# Patient Record
Sex: Female | Born: 1960
Health system: Southern US, Community
[De-identification: ages and names within clinical notes are randomized; demographics above are authoritative.]

## PROBLEM LIST (undated history)

## (undated) DIAGNOSIS — I10 Essential (primary) hypertension: Secondary | ICD-10-CM

## (undated) DIAGNOSIS — K649 Unspecified hemorrhoids: Secondary | ICD-10-CM

## (undated) DIAGNOSIS — M1990 Unspecified osteoarthritis, unspecified site: Secondary | ICD-10-CM

## (undated) DIAGNOSIS — IMO0001 Reserved for inherently not codable concepts without codable children: Secondary | ICD-10-CM

## (undated) HISTORY — DX: Reserved for inherently not codable concepts without codable children: IMO0001

## (undated) HISTORY — DX: Unspecified osteoarthritis, unspecified site: M19.90

## (undated) HISTORY — DX: Essential (primary) hypertension: I10

## (undated) HISTORY — DX: Unspecified hemorrhoids: K64.9

## (undated) HISTORY — PX: POLYPECTOMY: SHX149

## (undated) HISTORY — PX: TUBAL LIGATION: SHX77

## (undated) HISTORY — PX: OTHER SURGICAL HISTORY: SHX169

---

## 1989-02-20 HISTORY — PX: OTHER SURGICAL HISTORY: SHX169

## 1992-02-21 HISTORY — PX: CHOLECYSTECTOMY: SHX55

## 1997-08-25 ENCOUNTER — Other Ambulatory Visit: Admission: RE | Admit: 1997-08-25 | Discharge: 1997-08-25 | Payer: Self-pay | Admitting: Obstetrics & Gynecology

## 1997-09-29 ENCOUNTER — Other Ambulatory Visit: Admission: RE | Admit: 1997-09-29 | Discharge: 1997-09-29 | Payer: Self-pay | Admitting: Obstetrics & Gynecology

## 2002-02-20 HISTORY — PX: VAGINAL HYSTERECTOMY: SUR661

## 2002-02-20 HISTORY — PX: OTHER SURGICAL HISTORY: SHX169

## 2002-10-07 ENCOUNTER — Other Ambulatory Visit: Admission: RE | Admit: 2002-10-07 | Discharge: 2002-10-07 | Payer: Self-pay | Admitting: Obstetrics and Gynecology

## 2003-02-02 ENCOUNTER — Observation Stay (HOSPITAL_COMMUNITY): Admission: RE | Admit: 2003-02-02 | Discharge: 2003-02-03 | Payer: Self-pay | Admitting: Obstetrics and Gynecology

## 2003-02-02 ENCOUNTER — Encounter (INDEPENDENT_AMBULATORY_CARE_PROVIDER_SITE_OTHER): Payer: Self-pay | Admitting: *Deleted

## 2004-09-29 ENCOUNTER — Other Ambulatory Visit: Admission: RE | Admit: 2004-09-29 | Discharge: 2004-09-29 | Payer: Self-pay | Admitting: Obstetrics and Gynecology

## 2004-11-15 ENCOUNTER — Ambulatory Visit (HOSPITAL_COMMUNITY): Admission: RE | Admit: 2004-11-15 | Discharge: 2004-11-15 | Payer: Self-pay | Admitting: Obstetrics and Gynecology

## 2005-03-06 ENCOUNTER — Encounter: Payer: Self-pay | Admitting: Family Medicine

## 2005-03-06 LAB — HM MAMMOGRAPHY: HM Mammogram: NEGATIVE

## 2005-03-06 LAB — CONVERTED CEMR LAB: Pap Smear: NEGATIVE

## 2006-04-20 ENCOUNTER — Ambulatory Visit: Payer: Self-pay | Admitting: Family Medicine

## 2006-09-20 ENCOUNTER — Encounter: Admission: RE | Admit: 2006-09-20 | Discharge: 2006-09-20 | Payer: Self-pay | Admitting: Occupational Medicine

## 2006-10-04 ENCOUNTER — Encounter: Admission: RE | Admit: 2006-10-04 | Discharge: 2006-10-04 | Payer: Self-pay | Admitting: Occupational Medicine

## 2007-07-31 ENCOUNTER — Ambulatory Visit: Payer: Self-pay | Admitting: Family Medicine

## 2007-07-31 DIAGNOSIS — IMO0001 Reserved for inherently not codable concepts without codable children: Secondary | ICD-10-CM | POA: Insufficient documentation

## 2007-07-31 DIAGNOSIS — R071 Chest pain on breathing: Secondary | ICD-10-CM | POA: Insufficient documentation

## 2007-07-31 HISTORY — DX: Reserved for inherently not codable concepts without codable children: IMO0001

## 2007-08-14 ENCOUNTER — Telehealth (INDEPENDENT_AMBULATORY_CARE_PROVIDER_SITE_OTHER): Payer: Self-pay | Admitting: Internal Medicine

## 2007-09-03 ENCOUNTER — Ambulatory Visit: Payer: Self-pay | Admitting: Family Medicine

## 2007-09-11 ENCOUNTER — Ambulatory Visit: Payer: Self-pay | Admitting: Family Medicine

## 2007-09-16 LAB — CONVERTED CEMR LAB
ALT: 11 units/L (ref 0–35)
AST: 17 units/L (ref 0–37)
Alkaline Phosphatase: 47 units/L (ref 39–117)
BUN: 13 mg/dL (ref 6–23)
Basophils Absolute: 0 10*3/uL (ref 0.0–0.1)
Basophils Relative: 0.8 % (ref 0.0–3.0)
CO2: 28 meq/L (ref 19–32)
Chloride: 108 meq/L (ref 96–112)
Creatinine, Ser: 0.7 mg/dL (ref 0.4–1.2)
Eosinophils Absolute: 0.2 10*3/uL (ref 0.0–0.7)
Eosinophils Relative: 4.2 % (ref 0.0–5.0)
GFR calc non Af Amer: 97 mL/min
HDL: 51.1 mg/dL (ref >39.0)
LDL Cholesterol: 118 mg/dL — ABNORMAL HIGH (ref 0–99)
Lymphocytes Relative: 31.4 % (ref 12.0–46.0)
Neutrophils Relative %: 53.3 % (ref 43.0–77.0)
Platelets: 279 10*3/uL (ref 150–400)
Potassium: 4.1 meq/L (ref 3.5–5.1)
RBC: 4.47 M/uL (ref 3.87–5.11)
TSH: 1.1 microintl units/mL (ref 0.35–5.50)
Total Bilirubin: 0.9 mg/dL (ref 0.3–1.2)
VLDL: 9 mg/dL (ref 0–40)
WBC: 4 10*3/uL — ABNORMAL LOW (ref 4.5–10.5)

## 2007-12-02 ENCOUNTER — Ambulatory Visit: Payer: Self-pay | Admitting: Obstetrics and Gynecology

## 2007-12-02 ENCOUNTER — Encounter: Payer: Self-pay | Admitting: Obstetrics and Gynecology

## 2007-12-02 ENCOUNTER — Other Ambulatory Visit: Admission: RE | Admit: 2007-12-02 | Discharge: 2007-12-02 | Payer: Self-pay | Admitting: Obstetrics and Gynecology

## 2007-12-27 ENCOUNTER — Encounter: Admission: RE | Admit: 2007-12-27 | Discharge: 2007-12-27 | Payer: Self-pay | Admitting: Surgery

## 2008-01-09 ENCOUNTER — Encounter (INDEPENDENT_AMBULATORY_CARE_PROVIDER_SITE_OTHER): Payer: Self-pay | Admitting: Internal Medicine

## 2008-01-09 ENCOUNTER — Ambulatory Visit: Payer: Self-pay | Admitting: Family Medicine

## 2008-01-09 DIAGNOSIS — I10 Essential (primary) hypertension: Secondary | ICD-10-CM | POA: Insufficient documentation

## 2008-01-30 ENCOUNTER — Ambulatory Visit (HOSPITAL_BASED_OUTPATIENT_CLINIC_OR_DEPARTMENT_OTHER): Admission: RE | Admit: 2008-01-30 | Discharge: 2008-01-30 | Payer: Self-pay | Admitting: Surgery

## 2008-01-30 ENCOUNTER — Encounter (INDEPENDENT_AMBULATORY_CARE_PROVIDER_SITE_OTHER): Payer: Self-pay | Admitting: Surgery

## 2008-02-11 ENCOUNTER — Ambulatory Visit: Payer: Self-pay | Admitting: Family Medicine

## 2008-05-26 ENCOUNTER — Encounter: Admission: RE | Admit: 2008-05-26 | Discharge: 2008-05-26 | Payer: Self-pay | Admitting: Obstetrics and Gynecology

## 2008-06-19 ENCOUNTER — Ambulatory Visit: Payer: Self-pay | Admitting: Family Medicine

## 2008-06-25 ENCOUNTER — Encounter (INDEPENDENT_AMBULATORY_CARE_PROVIDER_SITE_OTHER): Payer: Self-pay | Admitting: Internal Medicine

## 2008-06-25 ENCOUNTER — Ambulatory Visit: Payer: Self-pay | Admitting: Family Medicine

## 2008-12-02 ENCOUNTER — Encounter: Payer: Self-pay | Admitting: Obstetrics and Gynecology

## 2008-12-02 ENCOUNTER — Ambulatory Visit: Payer: Self-pay | Admitting: Obstetrics and Gynecology

## 2008-12-02 ENCOUNTER — Other Ambulatory Visit: Admission: RE | Admit: 2008-12-02 | Discharge: 2008-12-02 | Payer: Self-pay | Admitting: Obstetrics and Gynecology

## 2009-01-27 ENCOUNTER — Ambulatory Visit: Payer: Self-pay | Admitting: Family Medicine

## 2009-01-29 ENCOUNTER — Ambulatory Visit: Payer: Self-pay | Admitting: Family Medicine

## 2009-02-03 LAB — CONVERTED CEMR LAB
Alkaline Phosphatase: 32 units/L — ABNORMAL LOW (ref 39–117)
BUN: 7 mg/dL (ref 6–23)
Basophils Absolute: 0 10*3/uL (ref 0.0–0.1)
Basophils Relative: 0.4 % (ref 0.0–3.0)
Bilirubin, Direct: 0 mg/dL (ref 0.0–0.3)
CO2: 28 meq/L (ref 19–32)
Calcium: 8.6 mg/dL (ref 8.4–10.5)
Chloride: 106 meq/L (ref 96–112)
Creatinine, Ser: 0.5 mg/dL (ref 0.4–1.2)
Eosinophils Absolute: 0.2 10*3/uL (ref 0.0–0.7)
Glucose, Bld: 83 mg/dL (ref 70–99)
Lymphocytes Relative: 30.4 % (ref 12.0–46.0)
MCHC: 33.8 g/dL (ref 30.0–36.0)
MCV: 90.9 fL (ref 78.0–100.0)
Monocytes Absolute: 0.4 10*3/uL (ref 0.1–1.0)
Neutrophils Relative %: 56 % (ref 43.0–77.0)
Platelets: 303 10*3/uL (ref 150.0–400.0)
RDW: 11.2 % — ABNORMAL LOW (ref 11.5–14.6)
Total Bilirubin: 0.6 mg/dL (ref 0.3–1.2)
Total Protein: 7.2 g/dL (ref 6.0–8.3)

## 2010-03-01 ENCOUNTER — Ambulatory Visit
Admission: RE | Admit: 2010-03-01 | Discharge: 2010-03-01 | Payer: Self-pay | Source: Home / Self Care | Attending: Family Medicine | Admitting: Family Medicine

## 2010-03-01 ENCOUNTER — Other Ambulatory Visit: Payer: Self-pay | Admitting: Family Medicine

## 2010-03-02 LAB — BASIC METABOLIC PANEL
BUN: 12 mg/dL (ref 6–23)
CO2: 27 mEq/L (ref 19–32)
Calcium: 9.5 mg/dL (ref 8.4–10.5)
Chloride: 104 mEq/L (ref 96–112)
Creatinine, Ser: 0.5 mg/dL (ref 0.4–1.2)
GFR: 131.15 mL/min (ref >60.00)
Glucose, Bld: 82 mg/dL (ref 70–99)
Potassium: 4.5 mEq/L (ref 3.5–5.1)
Sodium: 139 mEq/L (ref 135–145)

## 2010-03-02 LAB — HEPATIC FUNCTION PANEL
ALT: 11 U/L (ref 0–35)
AST: 18 U/L (ref 0–37)
Albumin: 4.1 g/dL (ref 3.5–5.2)
Alkaline Phosphatase: 43 U/L (ref 39–117)
Bilirubin, Direct: 0.1 mg/dL (ref 0.0–0.3)
Total Bilirubin: 0.5 mg/dL (ref 0.3–1.2)
Total Protein: 7.4 g/dL (ref 6.0–8.3)

## 2010-03-03 ENCOUNTER — Encounter: Payer: Self-pay | Admitting: Family Medicine

## 2010-03-24 NOTE — Miscellaneous (Signed)
Summary: old chart input  Clinical Lists Changes  Observations: Added new observation of PAST SURG HX: mult R ankle repairs 1990s NSVD x 2 BTL 1991 Cholecystectomy 1994 hysterectomy, partial for fibroids 2003 (03/03/2010 17:57) Added new observation of PAST MED HX: HTN arthritis (03/03/2010 17:57) Added new observation of MAMMOGRAM: negaitve (03/06/2005 17:59) Added new observation of PAP SMEAR: negative (03/06/2005 17:59)       Allergies: No Known Drug Allergies   Past History:  Past Medical History: HTN arthritis  Past Surgical History: mult R ankle repairs 1990s NSVD x 2 BTL 1991 Cholecystectomy 1994 hysterectomy, partial for fibroids 2003   -  Date:  03/06/2005    PAP negative    Mammogram negaitve

## 2010-03-24 NOTE — Assessment & Plan Note (Signed)
Summary: Stacey Bean PT/REFILL BP MED/CLE   Vital Signs:  Patient profile:   50 year old female Height:      66 inches Weight:      174.25 pounds BMI:     28.23 Temp:     98.7 degrees F oral Pulse rate:   64 / minute Pulse rhythm:   regular BP sitting:   138 / 90  (left arm) Cuff size:   regular  Vitals Entered By: Selena Batten Dance CMA (AAMA) (March 01, 2010 4:00 PM) CC: Med refill   History of Present Illness: CC: HTN  HTN - no HA, vision changes, chest pain, tightness, urinary changes, LE swelling.  compliant with med.  notices bps running 130/80-90 at home as well.  walks 2 mi/day, 5days/wk.  has overall healthy diet, doesn't add salt.  wears contacts, vision checked yearly.  Current Medications (verified): 1)  Lisinopril 10 Mg Tabs (Lisinopril) .Marland Kitchen.. 1 Once Daily For Bp By Mouth  Allergies (verified): No Known Drug Allergies  Past History:  Past Medical History: HTN  Family History: F: HTN, CVA M: DM, HTN, melanoma and stomach cancer.  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   Neck:  No deformities, masses, or tenderness noted.  no LAD, no bruits Lungs:  Normal respiratory effort, chest expands symmetrically. Lungs are clear to auscultation, no crackles or wheezes. Heart:  Normal rate and regular rhythm. S1 and S2 normal without gallop, murmur, click, rub or other extra sounds. Abdomen:  Bowel sounds positive,abdomen soft and non-tender without masses, organomegaly or hernias noted.  no abd/renal bruits Pulses:  2+ rad pulses,  brisk cap refill Extremities:  no pedal edema   Impression & Recommendations:  Problem # 1:  HYPERTENSION (ICD-401.9) Assessment Unchanged  borderline goal.  will add on HCTZ to med.  likely will have good control with this.  discussed healthy eating as well as decreased salt intake, encouraged continued exercise.  Her updated medication list for this problem includes:    Lisinopril-hydrochlorothiazide 10-12.5 Mg Tabs (Lisinopril-hydrochlorothiazide) .Marland Kitchen... Take one by mouth daily for blood pressure (use this script)  Orders: TLB-BMP (Basic Metabolic Panel-BMET) (80048-METABOL) TLB-Hepatic/Liver Function Pnl (80076-HEPATIC)  BP today: 138/90 Prior BP: 124/84 (01/27/2009)  Labs Reviewed: K+: 4.1 (01/29/2009) Creat: : 0.5 (01/29/2009)   Chol: 178 (09/11/2007)   HDL: 51.1 (09/11/2007)   LDL: 118 (09/11/2007)   TG: 45 (09/11/2007)  Complete Medication List: 1)  Lisinopril-hydrochlorothiazide 10-12.5 Mg Tabs (Lisinopril-hydrochlorothiazide) .... Take one by mouth daily for blood pressure (use this script)  Patient Instructions: 1)  We will start new combination medicine for blood pressure. 2)  Blood work today 3)  Goal salt is <2gm/day, ideally <1.5gm/day sodium intake. 4)  Return in 1 year for follow up or as needed. Prescriptions: LISINOPRIL-HYDROCHLOROTHIAZIDE 10-12.5 MG TABS (LISINOPRIL-HYDROCHLOROTHIAZIDE) take one by mouth daily for blood pressure (use this script)  #90 x 3   Entered and Authorized by:   Eustaquio Boyden  MD   Signed by:   Eustaquio Boyden  MD on 03/01/2010   Method used:   [facility] to  [facility] (retail)       [address]       [place], [place]  [address]       Ph: [phone] or [phone]       Fax: [phone]   RxID:   [id number] LISINOPRIL 10 MG TABS (LISINOPRIL) 1 once daily for BP by mouth  #90 x 3   Entered and Authorized by:   Eustaquio Boyden  MD   Signed by:   Eustaquio Boyden  MD on 03/01/2010    Method used:   [facility]        [id number]       [address]       [place], [place]  [address]       Ph: [phone] or [phone]       Fax: [phone]   RxID:   [id number]    Orders Added: 1)  TLB-BMP (Basic Metabolic Panel-BMET) [80048-METABOL] 2)  TLB-Hepatic/Liver Function Pnl [80076-HEPATIC] 3)  Est. Patient Level III [20254]    Current Allergies (reviewed today): No known allergies

## 2010-07-05 NOTE — Op Note (Signed)
Stacey Bean, Stacey Bean                 ACCOUNT NO.:  [id number]   MEDICAL RECORD NO.:  [id number]          PATIENT TYPE:  AMB   LOCATION:  DSC                          FACILITY:  [facility]   PHYSICIAN:  Thornton Park. Daphine Deutscher, MD  DATE OF BIRTH:  Jun 24, 1960   DATE OF PROCEDURE:  DATE OF DISCHARGE:                               OPERATIVE REPORT   OPERATIVE DIAGNOSIS:  Mass on back, most likely a cyst by MR.   HISTORY:  This is a 50 year old white female who presented with an  increasing mass on her back to the right of the midline above the belt  line.  When I saw her in the office, she has had a dark blackish cast  underneath the skin and was worrisome for something potentially more  aggressive, hence an MR was performed which showed this to be more like  a complex cyst.  It was approached as such.   SURGEON:  Thornton Park. Daphine Deutscher, MD   ANESTHESIA:  General in the prone position.   DESCRIPTION OF PROCEDURE:  The patient was taken to room #3 [facility] on January 30, 2008, and given general anesthesia.  She was  rolled in the prone position.  The area of her backside was prepped with  a chlorhexidine substance and draped sterilely.  I excised an ellipse of  skin after first injecting with some Marcaine.  I did not identify a  punctum with that injection, however, I went ahead and excised an  ellipse of skin, carried this down to the cyst wall, which I got into a  plane.  I was able to excise it without entering it at all.  It was very  large at least 3-4 cm in greatest dimension and I was able to excise it  in toto without rupturing it.  It was detached and sent for permanent  pathologic sections.  Meanwhile, I went ahead and used electrocautery to  contain minimal oozing and closed the wound in layers with 4-0 Vicryl in  the deep margin of the wound and then subcutaneously and subcuticularly,  ultimately closing the skin with Dermabond.  The patient seemed to  tolerate the procedure  well.  She was given a prescription for Vicodin  for pain and instructions were given to her friend about wound care and  followup.  Pathology is pending.      Thornton Park Daphine Deutscher, MD  Electronically Signed     MBM/MEDQ  D:  01/30/2008  T:  01/30/2008  Job:  [id number]   cc:   Rande Brunt. Eda Paschal, M.D.

## 2010-07-08 NOTE — H&P (Signed)
NAME:  Stacey Bean, Stacey Bean                           ACCOUNT NO.:  [id number]   MEDICAL RECORD NO.:  [id number]                   PATIENT TYPE:  OBV   LOCATION:  NA                                   [facility]   PHYSICIAN:  Daniel L. Eda Paschal, M.D.           DATE OF BIRTH:  05/11/1960   DATE OF ADMISSION:  02/02/2003  DATE OF DISCHARGE:                                HISTORY & PHYSICAL   CHIEF COMPLAINT:  Menometrorrhagia with submucous myomas.   HISTORY OF PRESENT ILLNESS:  The patient is a 50 year old, gravida 2, para  2, AB 0, who came to see me late this summer with a history of having  exceedingly heavy periods.  She also bleeds between the periods.  Assessment  included a normal thyroid screen and normal Pap smear.  The patient then  underwent a sonohysterogram which revealed normal ovaries, however, the  patient has two submucous myomas, as well as two other myomas and they are  not in the cavity as on the sonohysterogram the cavity itself was clean.  As  a result of this persistent menometrorrhagia, her previous tubal ligation  making further pregnancies not appropriate.  She now enters the hospital for  vaginal hysterectomy.  Once again, we will leave her ovaries.   PAST MEDICAL HISTORY:  The patient has had a cholecystectomy, a tubal  ligation, and a bone graft to her ankle after fractures.   PRESENT MEDICATIONS:  Iron.   ALLERGIES:  She is allergic to no drugs.   FAMILY HISTORY:  Her father is hypertensive.  An uncle is diabetic.   SOCIAL HISTORY:  She is a nonsmoker and a nondrinker.   REVIEW OF SYSTEMS:  HEENT:  Negative.  CARDIAC:  Negative.  RESPIRATORY:  Negative.  GASTROINTESTINAL:  Negative.  GENITOURINARY:  Negative.  NEUROMUSCULAR:  Negative.  ENDOCRINE:  Negative.   PHYSICAL EXAMINATION:  GENERAL APPEARANCE:  The patient is a well-developed,  well-nourished female in no acute distress.  VITAL SIGNS:  The blood pressure is 140/90, the pulse is 80 and  regular,  respirations 16 and nonlabored, and she is afebrile.  HEENT:  All within normal limits.  NECK:  Supple.  Trachea in the midline.  The thyroid is not enlarged.  LUNGS:  Clear to P&A.  HEART:  No thrills, heaves, or murmurs.  BREASTS:  No masses.  ABDOMEN:  Soft without guarding, rebound, or masses.  PELVIC:  External and vaginal are normal.  The cervix is clean.  The Pap  smear shows no atypia.  The uterus is boggy and enlarged with small myomas  to 7-8 weeks size.  The adnexa are palpably normal.  RECTAL:  Negative.   ADMISSION IMPRESSION:  1. Persistent menorrhagia.  2. Multiple myoma, including two submucous ones.   PLAN:  Vaginal hysterectomy.  Daniel L. Eda Paschal, M.D.    Stacey Bean  D:  02/01/2003  T:  02/01/2003  Job:  [id number]

## 2010-07-08 NOTE — Op Note (Signed)
NAME:  LELAH, RENNAKER                           ACCOUNT NO.:  [id number]   MEDICAL RECORD NO.:  [id number]                   PATIENT TYPE:  OBV   LOCATION:  9322                                 FACILITY:  [facility]   PHYSICIAN:  Daniel L. Eda Paschal, M.D.           DATE OF BIRTH:  12-13-1960   DATE OF PROCEDURE:  02/02/2003  DATE OF DISCHARGE:                                 OPERATIVE REPORT   PREOPERATIVE DIAGNOSIS:  Menometrorrhagia.   POSTOPERATIVE DIAGNOSIS:  Menometrorrhagia.   OPERATION:  1. Vaginal hysterectomy.  2. Excision of left hydatid cyst.   SURGEON:  Daniel L. Eda Paschal, M.D.   FIRST ASSISTANT:  Juan H. Lily Peer, M.D.   ANESTHESIA:  General.   FINDINGS:  At the time of surgery the patient's uterus is boggy and large by  small myomas, including several submucous ones.  Total weight of the uterus  was 190 g.  Ovaries and fallopian tubes were normal except for a hydatid  cyst on her left fallopian tube on a long pedicle.  The pelvic peritoneum  was free of disease.   DESCRIPTION OF PROCEDURE:  After adequate general anesthesia, the patient  was placed in the dorsal supine position and prepped and draped in the usual  sterile manner.  A 1:200,000 solution of epinephrine and 0.5% Xylocaine was  injected around the cervix.  A 360 degree incision was made around the  cervix.  The bladder was mobilized superiorly, as was the posterior  peritoneum.  The posterior peritoneum and vesicouterine fold of peritoneum  were entered by sharp dissection.  Uterosacral ligaments were clamped.  In  clamping them, they were shortened and then they were sutured to the vault  for good vault support.  Cardinal ligaments, uterine arteries, and balance  of the broad ligament were clamped, cut, and suture ligated as well.  The  uterus was partially delivered.  It had to be morcellated in order to get it  out because of the size of it.  After morcellation was done, the top of the  adnexa could  be separated from the uterus, specifically the utero-ovarian  ligament and round ligament and fallopian tube, and then the uterus was sent  to pathology for tissue diagnosis.  Suture material for all the above-  mentioned pedicles was #1 chromic catgut with major vascular bundles doubly  ligated.  Two sponge, needle, and instrument counts were correct.  Copious  irrigation was done with Ringer's lactate.  A hydatid cyst on the left  fallopian tube was removed and the base was treated with cautery to prevent  bleeding, and then the vaginal cuff was sutured to the posterior peritoneum  with a running locking 0 Vicryl.  A modified Moschowitz enterocele  prevention suture was placed with 2-0 Vicryl and then the cuff and the  peritoneum were closed with figure-of-eights of #1 chromic.  Estimated blood  loss for the entire procedure was 100  mL with none replaced.  The patient  tolerated the procedure well and at the termination of the procedure, a  Foley catheter was inserted, which drained clear urine.                                              Daniel L. Eda Paschal, M.D.   Tonette Bihari  D:  02/02/2003  T:  02/02/2003  Job:  [id number]

## 2010-11-01 ENCOUNTER — Ambulatory Visit (INDEPENDENT_AMBULATORY_CARE_PROVIDER_SITE_OTHER): Payer: 59 | Admitting: Family Medicine

## 2010-11-01 ENCOUNTER — Encounter: Payer: Self-pay | Admitting: Family Medicine

## 2010-11-01 DIAGNOSIS — B009 Herpesviral infection, unspecified: Secondary | ICD-10-CM

## 2010-11-01 DIAGNOSIS — B001 Herpesviral vesicular dermatitis: Secondary | ICD-10-CM | POA: Insufficient documentation

## 2010-11-01 DIAGNOSIS — L259 Unspecified contact dermatitis, unspecified cause: Secondary | ICD-10-CM

## 2010-11-01 MED ORDER — TRIAMCINOLONE ACETONIDE 0.1 % EX CREA: TOPICAL_CREAM | Freq: Three times a day (TID) | CUTANEOUS | Status: DC

## 2010-11-01 MED ORDER — PREDNISONE 5 MG PO KIT: PACK | ORAL | Status: DC

## 2010-11-01 MED ORDER — VALACYCLOVIR HCL 1 G PO TABS: 2000.0000 mg | ORAL_TABLET | Freq: Two times a day (BID) | ORAL | Status: AC

## 2010-11-01 NOTE — Progress Notes (Signed)
~  10 days ago she was working in the yard.  Was wearing gloves.  Likely poison ivy exposure.  Since then with lesions on arms and legs. Worse this past weekend.  Itches, burns.  No FCNAV.  Feeling well except for the rash.  No h/o poison ivy reaction before.    It was unclear if she was going to develop a fever blister on her lower lip.   Meds, vitals, and allergies reviewed.   ROS: See HPI.  Otherwise, noncontributory.  nad Lips appear normal Blanching red lesions on the L forearm and B popliteal areas, some vesicles notes, no purulent areas.

## 2010-11-01 NOTE — Patient Instructions (Signed)
Take the valtrex if your have a blister on your lip. Use the cream as needed, up to 3 times a day.  If not improved, take the prednisone with food.

## 2010-11-01 NOTE — Assessment & Plan Note (Signed)
Try topical steroid with caution, proceed to oral steroid if sx persist, steroid cautions given for both.  She agrees.  F/u prn.

## 2010-11-01 NOTE — Assessment & Plan Note (Signed)
Use valtrex if lesion occurs.

## 2010-11-25 LAB — BASIC METABOLIC PANEL
BUN: 11 mg/dL (ref 6–23)
GFR calc Af Amer: >60 mL/min (ref >60)
GFR calc non Af Amer: >60 mL/min (ref >60)
Potassium: 3.6 mEq/L (ref 3.5–5.1)

## 2010-11-25 LAB — POCT HEMOGLOBIN-HEMACUE: Hemoglobin: 12.2 g/dL (ref 12.0–15.0)

## 2010-12-26 ENCOUNTER — Encounter: Payer: Self-pay | Admitting: Gynecology

## 2010-12-26 DIAGNOSIS — N921 Excessive and frequent menstruation with irregular cycle: Secondary | ICD-10-CM | POA: Insufficient documentation

## 2011-01-10 ENCOUNTER — Ambulatory Visit (INDEPENDENT_AMBULATORY_CARE_PROVIDER_SITE_OTHER): Payer: 59 | Admitting: Obstetrics and Gynecology

## 2011-01-10 ENCOUNTER — Encounter: Payer: Self-pay | Admitting: Obstetrics and Gynecology

## 2011-01-10 ENCOUNTER — Other Ambulatory Visit (HOSPITAL_COMMUNITY)
Admission: RE | Admit: 2011-01-10 | Discharge: 2011-01-10 | Disposition: A | Discharge status: Home / Self Care | Payer: 59 | Source: Ambulatory Visit | Attending: Obstetrics and Gynecology | Admitting: Obstetrics and Gynecology

## 2011-01-10 VITALS — BP 130/74 | Ht 67.0 in | Wt 170.0 lb

## 2011-01-10 DIAGNOSIS — Z01419 Encounter for gynecological examination (general) (routine) without abnormal findings: Secondary | ICD-10-CM

## 2011-01-10 DIAGNOSIS — R823 Hemoglobinuria: Secondary | ICD-10-CM

## 2011-01-10 NOTE — Progress Notes (Signed)
Patient came to see me today for her annual GYN exam. She is doing well without menopausal symptoms. It has been 2-1/2 years since her last mammogram. She is having no bleeding her pelvic pain. When we last saw her her [facility] was 15. She does her lab work through her PCP.  AVW:UJWJ pertinent positives hypertension. Patient does not have fibromyalgia as was listed in her problem list.  HEENT: Within normal limits. [name] present Neck: No masses. Supraclavicular lymph nodes: Not enlarged. Breasts: Examined in both sitting and lying position. Symmetrical without skin changes or masses. Abdomen: Soft no masses guarding or rebound. No hernias. Pelvic: External within normal limits. BUS within normal limits. Vaginal examination shows good estrogen effect, no cystocele enterocele or rectocele. Cervix and uterus absent. Adnexa within normal limits. Rectovaginal confirmatory. Extremities within normal limits.   Assessment: Normal GYN exam.  Plan: Mammogram.

## 2011-01-10 NOTE — Assessment & Plan Note (Signed)
Patient was never diagnosed with fibromyalgia

## 2011-01-10 NOTE — Progress Notes (Signed)
Addended by: Landis Martins R on: 01/10/2011 03:00 PM   Modules accepted: Orders

## 2011-01-12 LAB — URINE CULTURE: Colony Count: NO GROWTH

## 2011-01-17 ENCOUNTER — Other Ambulatory Visit: Payer: Self-pay | Admitting: Obstetrics and Gynecology

## 2011-01-17 DIAGNOSIS — Z1231 Encounter for screening mammogram for malignant neoplasm of breast: Secondary | ICD-10-CM

## 2011-02-20 ENCOUNTER — Ambulatory Visit (HOSPITAL_COMMUNITY)
Admission: RE | Admit: 2011-02-20 | Discharge: 2011-02-20 | Disposition: A | Discharge status: Home / Self Care | Payer: 59 | Source: Ambulatory Visit | Attending: Obstetrics and Gynecology | Admitting: Obstetrics and Gynecology

## 2011-02-20 DIAGNOSIS — Z1231 Encounter for screening mammogram for malignant neoplasm of breast: Secondary | ICD-10-CM

## 2011-03-03 ENCOUNTER — Other Ambulatory Visit: Payer: Self-pay | Admitting: *Deleted

## 2011-03-03 DIAGNOSIS — N6310 Unspecified lump in the right breast, unspecified quadrant: Secondary | ICD-10-CM

## 2011-03-10 ENCOUNTER — Other Ambulatory Visit: Payer: 59

## 2011-03-14 ENCOUNTER — Ambulatory Visit
Admission: RE | Admit: 2011-03-14 | Discharge: 2011-03-14 | Disposition: A | Discharge status: Home / Self Care | Payer: 59 | Source: Ambulatory Visit | Attending: Obstetrics and Gynecology | Admitting: Obstetrics and Gynecology

## 2011-03-14 DIAGNOSIS — N6310 Unspecified lump in the right breast, unspecified quadrant: Secondary | ICD-10-CM

## 2011-03-16 ENCOUNTER — Ambulatory Visit
Admission: RE | Admit: 2011-03-16 | Discharge: 2011-03-16 | Disposition: A | Discharge status: Home / Self Care | Payer: 59 | Source: Ambulatory Visit | Attending: Obstetrics and Gynecology | Admitting: Obstetrics and Gynecology

## 2011-03-16 ENCOUNTER — Other Ambulatory Visit: Payer: Self-pay | Admitting: Obstetrics and Gynecology

## 2011-03-16 DIAGNOSIS — N6310 Unspecified lump in the right breast, unspecified quadrant: Secondary | ICD-10-CM

## 2011-04-11 ENCOUNTER — Encounter: Payer: Self-pay | Admitting: Family Medicine

## 2011-05-02 ENCOUNTER — Other Ambulatory Visit: Payer: Self-pay | Admitting: *Deleted

## 2011-05-02 MED ORDER — LISINOPRIL-HYDROCHLOROTHIAZIDE 10-12.5 MG PO TABS: 1.0000 | ORAL_TABLET | Freq: Every day | ORAL | Status: DC

## 2011-06-12 ENCOUNTER — Ambulatory Visit (INDEPENDENT_AMBULATORY_CARE_PROVIDER_SITE_OTHER): Payer: 59 | Admitting: Family Medicine

## 2011-06-12 ENCOUNTER — Encounter: Payer: Self-pay | Admitting: Family Medicine

## 2011-06-12 VITALS — BP 130/80 | HR 72 | Temp 98.2°F | Ht 66.0 in | Wt 170.0 lb

## 2011-06-12 DIAGNOSIS — I10 Essential (primary) hypertension: Secondary | ICD-10-CM

## 2011-06-12 LAB — BASIC METABOLIC PANEL WITH GFR
BUN: 11 mg/dL (ref 6–23)
CO2: 28 meq/L (ref 19–32)
Calcium: 9.3 mg/dL (ref 8.4–10.5)
Chloride: 103 meq/L (ref 96–112)
Creatinine, Ser: 0.8 mg/dL (ref 0.4–1.2)
GFR: 86.06 mL/min
Glucose, Bld: 84 mg/dL (ref 70–99)
Potassium: 3.8 meq/L (ref 3.5–5.1)
Sodium: 139 meq/L (ref 135–145)

## 2011-06-12 LAB — LIPID PANEL
Cholesterol: 171 mg/dL (ref 0–200)
LDL Cholesterol: 105 mg/dL — ABNORMAL HIGH (ref 0–99)
Total CHOL/HDL Ratio: 3

## 2011-06-12 MED ORDER — LISINOPRIL-HYDROCHLOROTHIAZIDE 10-12.5 MG PO TABS: 1.0000 | ORAL_TABLET | Freq: Every day | ORAL | Status: DC

## 2011-06-12 NOTE — Progress Notes (Signed)
Addended by: Alvina Chou on: 06/12/2011 09:21 AM   Modules accepted: Orders

## 2011-06-12 NOTE — Patient Instructions (Signed)
Great to see you today. Blood work today. You are doing well. Return as needed or in 1 year for next blood pressure check. Come in at age 51 for physical.

## 2011-06-12 NOTE — Assessment & Plan Note (Signed)
Chronic well controlled. Continue combo pill.

## 2011-06-12 NOTE — Progress Notes (Signed)
  Subjective:    Patient ID: Stacey Bean, female    DOB: 1960-05-10, 51 y.o.   MRN: [id number]  HPI CC: med refill  Mother died age 56 in April 20, 2011 unexpectedly of massive CVA.  HTN - No HA, vision changes, CP/tightness, SOB.  Noticing ankle swelling but h/o ankle surgery, attributes to this.  Worse in evening and warmer weather.  Just on left where had ankle surgery.  No calf pain.  Tolerating and compliant with lisinopril Ethelda Chick.  Caffeine:  2 cups coffee, 1 cup of tea in the evening. Widowed , husband died at age 30 of colon cancer. Activity: treadmill Diet: good water, daily fruits/vegetables.  Mammogram - this year abnl mammo, scheduled biopsy but when came in for [place], normal results so instead going back to rpt in 6 mo.  Well woman - with OBGYN Dr. Dianne Dun  Past Medical History  Diagnosis Date  . Arthritis   . Hypertension   . Hydatid cyst   . Hemorrhoid     Review of Systems Per HPI    Objective:   Physical Exam  Nursing note and vitals reviewed. Constitutional: She appears well-developed and well-nourished. No distress.  HENT:  Head: Normocephalic and atraumatic.  Mouth/Throat: Oropharynx is clear and moist. No oropharyngeal exudate.  Eyes: Conjunctivae and EOM are normal. Pupils are equal, round, and reactive to light. No scleral icterus.  Neck: Normal range of motion. Neck supple. Carotid bruit is not present.  Cardiovascular: Normal rate, regular rhythm, normal heart sounds and intact distal pulses.   No murmur heard. Pulmonary/Chest: Effort normal and breath sounds normal. No respiratory distress. She has no wheezes. She has no rales.  Musculoskeletal: She exhibits no edema.  Lymphadenopathy:    She has no cervical adenopathy.  Skin: Skin is warm and dry. No rash noted.       Assessment & Plan:

## 2011-06-16 ENCOUNTER — Other Ambulatory Visit: Payer: Self-pay

## 2011-06-16 NOTE — Telephone Encounter (Signed)
Pt left v/m wanted her Lisinopril HCTZ sent to [facility] for 3 month rx. I spoke with Alinda Money at [facility] and she said was sent as 90 day supply but pts insurance will only allow 30 day fill.Patient notified as instructed by telephone.

## 2011-08-16 ENCOUNTER — Other Ambulatory Visit: Payer: Self-pay | Admitting: *Deleted

## 2011-08-16 DIAGNOSIS — N6459 Other signs and symptoms in breast: Secondary | ICD-10-CM

## 2011-08-28 ENCOUNTER — Ambulatory Visit
Admission: RE | Admit: 2011-08-28 | Discharge: 2011-08-28 | Disposition: A | Discharge status: Home / Self Care | Payer: 59 | Source: Ambulatory Visit | Attending: Obstetrics and Gynecology | Admitting: Obstetrics and Gynecology

## 2011-08-28 ENCOUNTER — Other Ambulatory Visit: Payer: Self-pay | Admitting: Obstetrics and Gynecology

## 2011-08-28 DIAGNOSIS — N6459 Other signs and symptoms in breast: Secondary | ICD-10-CM

## 2012-01-22 ENCOUNTER — Telehealth: Payer: Self-pay

## 2012-01-22 NOTE — Telephone Encounter (Signed)
Pt request last lab results for wellness form at work. Chol 171;trig 54.0;HDL 55.30;LDL 105 and Glu 52.WU voiced understanding.

## 2012-03-12 ENCOUNTER — Other Ambulatory Visit: Payer: Self-pay | Admitting: Gynecology

## 2012-03-12 DIAGNOSIS — Z1231 Encounter for screening mammogram for malignant neoplasm of breast: Secondary | ICD-10-CM

## 2012-04-04 ENCOUNTER — Ambulatory Visit: Payer: 59

## 2012-04-25 ENCOUNTER — Ambulatory Visit
Admission: RE | Admit: 2012-04-25 | Discharge: 2012-04-25 | Disposition: A | Discharge status: Home / Self Care | Payer: 59 | Source: Ambulatory Visit | Attending: Gynecology | Admitting: Gynecology

## 2012-04-25 DIAGNOSIS — Z1231 Encounter for screening mammogram for malignant neoplasm of breast: Secondary | ICD-10-CM

## 2012-04-30 ENCOUNTER — Other Ambulatory Visit: Payer: Self-pay | Admitting: *Deleted

## 2012-04-30 DIAGNOSIS — N63 Unspecified lump in unspecified breast: Secondary | ICD-10-CM

## 2012-05-08 ENCOUNTER — Ambulatory Visit
Admission: RE | Admit: 2012-05-08 | Discharge: 2012-05-08 | Disposition: A | Discharge status: Home / Self Care | Payer: 59 | Source: Ambulatory Visit | Attending: Gynecology | Admitting: Gynecology

## 2012-05-08 DIAGNOSIS — N63 Unspecified lump in unspecified breast: Secondary | ICD-10-CM

## 2012-05-17 ENCOUNTER — Telehealth: Payer: Self-pay

## 2012-05-17 NOTE — Telephone Encounter (Signed)
Pt wanted to know if 06/12/11 visit was considered CPX; advised pt coded as 15 min med refill visit. Pt wanted to know if could be recoded CPX and I said no. Pt will call back and schedule CPX.

## 2012-06-27 ENCOUNTER — Other Ambulatory Visit: Payer: Self-pay | Admitting: Family Medicine

## 2012-08-12 ENCOUNTER — Other Ambulatory Visit: Payer: Self-pay | Admitting: Family Medicine

## 2012-08-22 ENCOUNTER — Other Ambulatory Visit: Payer: Self-pay

## 2012-08-22 MED ORDER — LISINOPRIL-HYDROCHLOROTHIAZIDE 10-12.5 MG PO TABS: ORAL_TABLET | ORAL | Status: DC

## 2012-08-22 NOTE — Telephone Encounter (Signed)
Pt left v/m requesting refill of her medication (no name of med) to [facility]. Pt has CPX already scheduled on 09/02/12.Marland Kitchen Spoke with pt lisinopril HCTZ is med needed; advised pt refilled.

## 2012-09-02 ENCOUNTER — Ambulatory Visit (INDEPENDENT_AMBULATORY_CARE_PROVIDER_SITE_OTHER): Payer: 59 | Admitting: Family Medicine

## 2012-09-02 ENCOUNTER — Encounter: Payer: Self-pay | Admitting: Family Medicine

## 2012-09-02 VITALS — BP 130/70 | HR 84 | Temp 98.2°F | Ht 66.0 in | Wt 162.2 lb

## 2012-09-02 DIAGNOSIS — I10 Essential (primary) hypertension: Secondary | ICD-10-CM

## 2012-09-02 DIAGNOSIS — Z0000 Encounter for general adult medical examination without abnormal findings: Secondary | ICD-10-CM

## 2012-09-02 DIAGNOSIS — Z1211 Encounter for screening for malignant neoplasm of colon: Secondary | ICD-10-CM

## 2012-09-02 DIAGNOSIS — N951 Menopausal and female climacteric states: Secondary | ICD-10-CM

## 2012-09-02 LAB — BASIC METABOLIC PANEL
BUN: 11 mg/dL (ref 6–23)
CO2: 25 mEq/L (ref 19–32)
Calcium: 8.8 mg/dL (ref 8.4–10.5)
Creatinine, Ser: 0.8 mg/dL (ref 0.4–1.2)

## 2012-09-02 LAB — FOLLICLE STIMULATING HORMONE: FSH: 3.6 m[IU]/mL

## 2012-09-02 LAB — TSH: TSH: 0.97 u[IU]/mL (ref 0.35–5.50)

## 2012-09-02 LAB — LIPID PANEL
Total CHOL/HDL Ratio: 3
Triglycerides: 78 mg/dL (ref 0.0–149.0)

## 2012-09-02 MED ORDER — LISINOPRIL-HYDROCHLOROTHIAZIDE 10-12.5 MG PO TABS: ORAL_TABLET | ORAL | Status: DC

## 2012-09-02 NOTE — Assessment & Plan Note (Signed)
Preventative protocols reviewed and updated unless pt declined. Discussed healthy diet and lifestyle. Pt desires colonoscopy - will turn 52 on the 22nd of this month.  Husband with stage 4 colon cancer at age 50.

## 2012-09-02 NOTE — Assessment & Plan Note (Signed)
Check TSH and FSH 

## 2012-09-02 NOTE — Assessment & Plan Note (Signed)
Chronic, stable. continue meds. 

## 2012-09-02 NOTE — Progress Notes (Signed)
Subjective:    Patient ID: Stacey Bean, female    DOB: [birth year], 52 y.o.   MRN: [id number]  HPI CC: CPE  Wonders about menopause.  S/p hysterectomy, ovaries remain.  Requests hormone testing.  Occasional night hot flashes, but not typical hot flash and not significant.  Caffeine: 2 cups coffee, 1 cup of tea in the evening.  Widowed , husband died at age 62 of colon cancer.  Activity: treadmill  Diet: good water, daily fruits/vegetables.   Sunscreen use discussed, no sunburns recently. Seat belt use discussed.  Preventative: Mammogram - Q6 mo mammo 2/2 abnormal.  Pending rpt this year. Well woman - with OBGYN Dr. Dianne Dun Td 2009 Flu shot - yearly.  Medications and allergies reviewed and updated in chart.  Past histories reviewed and updated if relevant as below. Patient Active Problem List   Diagnosis Date Noted  . Menometrorrhagia   . Contact dermatitis 11/01/2010  . Fever blister 11/01/2010  . HYPERTENSION 01/09/2008  . FIBROMYALGIA 07/31/2007  . CHEST WALL PAIN, ACUTE 07/31/2007   Past Medical History  Diagnosis Date  . Arthritis   . Hypertension   . Hydatid cyst   . Hemorrhoid    Past Surgical History  Procedure Laterality Date  . Ankle repairs  1990's    Multiple - pan talar fusion after prior subtalar fusion  . Nsvd      X 2  . Btl  1991  . Cholecystectomy  1994  . Vaginal hysterectomy  2004    heavy bleeding, ovaries remain  . Hydatid cyst      Left   History  Substance Use Topics  . Smoking status: Former Games developer  . Smokeless tobacco: Never Used  . Alcohol Use: 1.0 oz/week    2 drink(s) per week     Comment: Moderate   Family History  Problem Relation Age of Onset  . Diabetes Mother   . Cancer Mother     Melanoma and stomach cancer  . Hypertension Father   . Stroke Father 30  . Coronary artery disease Neg Hx   . Hypertension Brother   . Stroke Mother 25    massive CVA   No Known Allergies Current Outpatient Prescriptions on File  Prior to Visit  Medication Sig Dispense Refill  . lisinopril-hydrochlorothiazide (PRINZIDE,ZESTORETIC) 10-12.5 MG per tablet TAKE 1 TABLET BY MOUTH DAILY.  30 tablet  0   No current facility-administered medications on file prior to visit.     Review of Systems  Constitutional: Negative for fever, chills, activity change, appetite change, fatigue and unexpected weight change.  HENT: Negative for hearing loss and neck pain.   Eyes: Negative for visual disturbance.  Respiratory: Negative for cough, chest tightness, shortness of breath and wheezing.   Cardiovascular: Negative for chest pain, palpitations and leg swelling.  Gastrointestinal: Negative for nausea, vomiting, abdominal pain, diarrhea, constipation, blood in stool and abdominal distention.  Genitourinary: Negative for hematuria and difficulty urinating.  Musculoskeletal: Negative for myalgias and arthralgias.  Skin: Negative for rash.  Neurological: Negative for dizziness, seizures, syncope and headaches.  Hematological: Negative for adenopathy. Does not bruise/bleed easily.  Psychiatric/Behavioral: Negative for dysphoric mood. The patient is not nervous/anxious.        Objective:   Physical Exam  Nursing note and vitals reviewed. Constitutional: She is oriented to person, place, and time. She appears well-developed and well-nourished. No distress.  HENT:  Head: Normocephalic and atraumatic.  Right Ear: External ear normal.  Left Ear: External ear normal.  Nose: Nose normal.  Mouth/Throat: Oropharynx is clear and moist. No oropharyngeal exudate.  Eyes: Conjunctivae and EOM are normal. Pupils are equal, round, and reactive to light. No scleral icterus.  Neck: Normal range of motion. Neck supple. No thyromegaly present.  Cardiovascular: Normal rate, regular rhythm, normal heart sounds and intact distal pulses.   No murmur heard. Pulses:      Radial pulses are 2+ on the right side, and 2+ on the left side.   Pulmonary/Chest: Effort normal and breath sounds normal. No respiratory distress. She has no wheezes. She has no rales.  Abdominal: Soft. Bowel sounds are normal. She exhibits no distension and no mass. There is no tenderness. There is no rebound and no guarding.  Musculoskeletal: Normal range of motion. She exhibits no edema.  Lymphadenopathy:    She has no cervical adenopathy.  Neurological: She is alert and oriented to person, place, and time.  CN grossly intact, station and gait intact  Skin: Skin is warm and dry. No rash noted.  Psychiatric: She has a normal mood and affect. Her behavior is normal. Judgment and thought content normal.       Assessment & Plan:

## 2012-09-02 NOTE — Patient Instructions (Addendum)
Nice to see you today, return as needed or in 1 year for next physical. Blood work today.

## 2012-09-03 ENCOUNTER — Encounter: Payer: Self-pay | Admitting: *Deleted

## 2012-09-05 ENCOUNTER — Encounter: Payer: Self-pay | Admitting: Gastroenterology

## 2012-09-20 ENCOUNTER — Telehealth: Payer: Self-pay

## 2012-09-20 NOTE — Telephone Encounter (Signed)
Pt was calling for status of refill lisinopril HCTZ; called [facility] [address]; was held out for 9 days and then put back on shelf; asked to refill for pt and pt will pick up.

## 2012-11-08 ENCOUNTER — Ambulatory Visit (AMBULATORY_SURGERY_CENTER): Payer: 59 | Admitting: *Deleted

## 2012-11-08 VITALS — Ht 66.0 in | Wt 166.4 lb

## 2012-11-08 DIAGNOSIS — Z1211 Encounter for screening for malignant neoplasm of colon: Secondary | ICD-10-CM

## 2012-11-08 MED ORDER — NA SULFATE-K SULFATE-MG SULF 17.5-3.13-1.6 GM/177ML PO SOLN: ORAL | Status: DC

## 2012-11-08 NOTE — Progress Notes (Signed)
No egg or soy allergies 

## 2012-11-12 ENCOUNTER — Encounter: Payer: Self-pay | Admitting: Gastroenterology

## 2012-11-20 HISTORY — PX: COLONOSCOPY: SHX174

## 2012-11-22 ENCOUNTER — Telehealth: Payer: Self-pay

## 2012-11-22 ENCOUNTER — Ambulatory Visit (AMBULATORY_SURGERY_CENTER): Payer: 59 | Admitting: Gastroenterology

## 2012-11-22 ENCOUNTER — Encounter: Payer: Self-pay | Admitting: Gastroenterology

## 2012-11-22 VITALS — BP 116/71 | HR 59 | Temp 99.1°F | Resp 17 | Ht 66.0 in | Wt 166.0 lb

## 2012-11-22 DIAGNOSIS — D126 Benign neoplasm of colon, unspecified: Secondary | ICD-10-CM

## 2012-11-22 DIAGNOSIS — K648 Other hemorrhoids: Secondary | ICD-10-CM

## 2012-11-22 DIAGNOSIS — Z1211 Encounter for screening for malignant neoplasm of colon: Secondary | ICD-10-CM

## 2012-11-22 DIAGNOSIS — K5730 Diverticulosis of large intestine without perforation or abscess without bleeding: Secondary | ICD-10-CM

## 2012-11-22 MED ORDER — SODIUM CHLORIDE 0.9 % IV SOLN: 500.0000 mL | INTRAVENOUS | Status: DC

## 2012-11-22 NOTE — Patient Instructions (Addendum)
YOU HAD AN ENDOSCOPIC PROCEDURE TODAY AT THE [facility]: Refer to the procedure report that was given to you for any specific questions about what was found during the examination.  If the procedure report does not answer your questions, please call your gastroenterologist to clarify.  If you requested that your care partner not be given the details of your procedure findings, then the procedure report has been included in a sealed envelope for you to review at your convenience later.  YOU SHOULD EXPECT: Some feelings of bloating in the abdomen. Passage of more gas than usual.  Walking can help get rid of the air that was put into your GI tract during the procedure and reduce the bloating. If you had a lower endoscopy (such as a colonoscopy or flexible sigmoidoscopy) you may notice spotting of blood in your stool or on the toilet paper. If you underwent a bowel prep for your procedure, then you may not have a normal bowel movement for a few days.  DIET: Your first meal following the procedure should be a light meal and then it is ok to progress to your normal diet.  A half-sandwich or bowl of soup is an example of a good first meal.  Heavy or fried foods are harder to digest and may make you feel nauseous or bloated.  Likewise meals heavy in dairy and vegetables can cause extra gas to form and this can also increase the bloating.  Drink plenty of fluids but you should avoid alcoholic beverages for 24 hours.  ACTIVITY: Your care partner should take you home directly after the procedure.  You should plan to take it easy, moving slowly for the rest of the day.  You can resume normal activity the day after the procedure however you should NOT DRIVE or use heavy machinery for 24 hours (because of the sedation medicines used during the test).    SYMPTOMS TO REPORT IMMEDIATELY: A gastroenterologist can be reached at any hour.  During normal business hours, 8:30 AM to 5:00 PM Monday through Friday,  call [phone].  After hours and on weekends, please call the GI answering service at [phone]  Emergency number who will take a message and have the physician on call contact you.   Following lower endoscopy (colonoscopy or flexible sigmoidoscopy):  Excessive amounts of blood in the stool  Significant tenderness or worsening of abdominal pains  Swelling of the abdomen that is new, acute  Fever of 100F or higher  FOLLOW UP: If any biopsies were taken you will be contacted by phone or by letter within the next 1-3 weeks.  Call your gastroenterologist if you have not heard about the biopsies in 3 weeks.  Our staff will call the home number listed on your records the next business day following your procedure to check on you and address any questions or concerns that you may have at that time regarding the information given to you following your procedure. This is a courtesy call and so if there is no answer at the home number and we have not heard from you through the emergency physician on call, we will assume that you have returned to your regular daily activities without incident.  SIGNATURES/CONFIDENTIALITY: You and/or your care partner have signed paperwork which will be entered into your electronic medical record.  These signatures attest to the fact that that the information above on your After Visit Summary has been reviewed and is understood.  Full responsibility of the   confidentiality of this discharge information lies with you and/or your care-partner.   Handout on polyps, diverticulosis, high fiber diet, polyps  Call office at [phone] in the next 1-3 days to schedule an office visit with Dr Arlyce Dice to address your hemorrhoids

## 2012-11-22 NOTE — Op Note (Signed)
[facility] [address] [place], [address]   COLONOSCOPY PROCEDURE REPORT  PATIENT: Stacey, Bean  MR#: [id number] BIRTHDATE: 10-30-1960 , 52  yrs. old GENDER: Female ENDOSCOPIST: Louis Meckel, MD REFERRED YQ:[id number] Gutierrez, M.D. PROCEDURE DATE:  11/22/2012 PROCEDURE:   Colonoscopy with cold biopsy polypectomy First Screening Colonoscopy - Avg.  risk and is 50 yrs.  old or older Yes.  Prior Negative Screening - Now for repeat screening. N/A  History of Adenoma - Now for follow-up colonoscopy & has been > or = to 3 yrs.  N/A  Polyps Removed Today? Yes. ASA CLASS:   Class II INDICATIONS:average risk screening. MEDICATIONS: MAC sedation, administered by CRNA and propofol (Diprivan) 300mg  IV  DESCRIPTION OF PROCEDURE:   After the risks benefits and alternatives of the procedure were thoroughly explained, informed consent was obtained.  A digital rectal exam revealed several skin tags.   The LB [id number]  endoscope was introduced through the anus and advanced to the cecum, which was identified by both the appendix and ileocecal valve. No adverse events experienced. The quality of the prep was excellent using Suprep  The instrument was then slowly withdrawn as the colon was fully examined.      COLON FINDINGS: A sessile polyp measuring 2 mm in size was found in the ascending colon.  A polypectomy was performed with cold forceps.   Moderate diverticulosis was noted in the sigmoid colon. Internal hemorrhoids were found.  Retroflexed views revealed no abnormalities. The time to cecum=3 minutes 24 seconds.  Withdrawal time=12 minutes 06 seconds.  The scope was withdrawn and the procedure completed. COMPLICATIONS: There were no complications.  ENDOSCOPIC IMPRESSION: 1.   Sessile polyp measuring 2 mm in size was found in the ascending colon; polypectomy was performed with cold forceps 2.   Moderate diverticulosis was noted in the sigmoid colon 3.    Internal hemorrhoids  RECOMMENDATIONS: If the polyp(s) removed today are proven to be adenomatous (pre-cancerous) polyps, you will need a repeat colonoscopy in 5 years.  Otherwise you should continue to follow colorectal cancer screening guidelines for "routine risk" patients with colonoscopy in 10 years.  You will receive a letter within 1-2 weeks with the results of your biopsy as well as final recommendations.  Please call my office if you have not received a letter after 3 weeks.   eSigned:  Louis Meckel, MD 11/22/2012 11:48 AM   cc:   PATIENT NAME:  Stacey, Bean MR#: [id number]

## 2012-11-22 NOTE — Telephone Encounter (Signed)
Pt scheduled for OV with Dr. Arlyce Dice 12/23/12@9 :15am. Pt aware.

## 2012-11-22 NOTE — Progress Notes (Signed)
Called to room to assist during endoscopic procedure.  Patient ID and intended procedure confirmed with present staff. Received instructions for my participation in the procedure from the performing physician.  

## 2012-11-22 NOTE — Progress Notes (Signed)
Patient did not experience any of the following events: a burn prior to discharge; a fall within the facility; wrong site/side/patient/procedure/implant event; or a hospital transfer or hospital admission upon discharge from the facility. (G8907)Patient did not have preoperative order for IV antibiotic SSI prophylaxis. (G8918) ewm 

## 2012-11-22 NOTE — Progress Notes (Signed)
Stable to RR 

## 2012-11-22 NOTE — Progress Notes (Signed)
Patient did not experience any of the following events: a burn prior to discharge; a fall within the facility; wrong site/side/patient/procedure/implant event; or a hospital transfer or hospital admission upon discharge from the facility. (G8907)  

## 2012-11-22 NOTE — Telephone Encounter (Signed)
Message copied by Chrystie Nose on Fri Nov 22, 2012  4:56 PM ------      Message from: Melvia Heaps D      Created: Fri Nov 22, 2012  4:28 PM       Office visit first      ----- Message -----         From: Lily Lovings, RN         Sent: 11/22/2012   2:58 PM           To: Louis Meckel, MD            Does she need an OV or a banding appt in the office.?                  ----- Message -----         From: Louis Meckel, MD         Sent: 11/22/2012  12:15 PM           To: Lily Lovings, RN            The patient needs an office visit because of symptomatic hemorrhoids             ------

## 2012-11-25 ENCOUNTER — Telehealth: Payer: Self-pay

## 2012-11-25 NOTE — Telephone Encounter (Signed)
Left message on answering machine. 

## 2012-11-27 ENCOUNTER — Encounter: Payer: Self-pay | Admitting: Family Medicine

## 2012-11-27 ENCOUNTER — Encounter: Payer: Self-pay | Admitting: Gastroenterology

## 2012-12-23 ENCOUNTER — Ambulatory Visit: Payer: 59 | Admitting: Gastroenterology

## 2013-01-06 ENCOUNTER — Ambulatory Visit (INDEPENDENT_AMBULATORY_CARE_PROVIDER_SITE_OTHER): Payer: 59 | Admitting: Gastroenterology

## 2013-01-06 ENCOUNTER — Encounter: Payer: Self-pay | Admitting: Gastroenterology

## 2013-01-06 VITALS — BP 112/78 | HR 79 | Ht 66.0 in | Wt 164.0 lb

## 2013-01-06 DIAGNOSIS — K648 Other hemorrhoids: Secondary | ICD-10-CM | POA: Insufficient documentation

## 2013-01-06 DIAGNOSIS — D126 Benign neoplasm of colon, unspecified: Secondary | ICD-10-CM

## 2013-01-06 NOTE — Assessment & Plan Note (Signed)
Followup colonoscopy 2019 

## 2013-01-06 NOTE — Patient Instructions (Signed)
Hemorrhoids Hemorrhoids are swollen veins around the rectum or anus. There are two types of hemorrhoids:   Internal hemorrhoids. These occur in the veins just inside the rectum. They may poke through to the outside and become irritated and painful.  External hemorrhoids. These occur in the veins outside the anus and can be felt as a painful swelling or hard lump near the anus. CAUSES  Pregnancy.   Obesity.   Constipation or diarrhea.   Straining to have a bowel movement.   Sitting for long periods on the toilet.  Heavy lifting or other activity that caused you to strain.  Anal intercourse. SYMPTOMS   Pain.   Anal itching or irritation.   Rectal bleeding.   Fecal leakage.   Anal swelling.   One or more lumps around the anus.  DIAGNOSIS  Your caregiver may be able to diagnose hemorrhoids by visual examination. Other examinations or tests that may be performed include:   Examination of the rectal area with a gloved hand (digital rectal exam).   Examination of anal canal using a small tube (scope).   A blood test if you have lost a significant amount of blood.  A test to look inside the colon (sigmoidoscopy or colonoscopy). TREATMENT Most hemorrhoids can be treated at home. However, if symptoms do not seem to be getting better or if you have a lot of rectal bleeding, your caregiver may perform a procedure to help make the hemorrhoids get smaller or remove them completely. Possible treatments include:  1. Placing a rubber band at the base of the hemorrhoid to cut off the circulation (rubber band ligation).  2. Injecting a chemical to shrink the hemorrhoid (sclerotherapy).  3. Using a tool to burn the hemorrhoid (infrared light therapy).  4. Surgically removing the hemorrhoid (hemorrhoidectomy).  5. Stapling the hemorrhoid to block blood flow to the tissue (hemorrhoid stapling).  HOME CARE INSTRUCTIONS   Eat foods with fiber, such as whole grains,  beans, nuts, fruits, and vegetables. Ask your doctor about taking products with added fiber in them (fibersupplements).  Increase fluid intake. Drink enough water and fluids to keep your urine clear or pale yellow.   Exercise regularly.   Go to the bathroom when you have the urge to have a bowel movement. Do not wait.   Avoid straining to have bowel movements.   Keep the anal area dry and clean. Use wet toilet paper or moist towelettes after a bowel movement.   Medicated creams and suppositories may be used or applied as directed.   Only take over-the-counter or prescription medicines as directed by your caregiver.   Take warm sitz baths for 15 20 minutes, 3 4 times a day to ease pain and discomfort.   Place ice packs on the hemorrhoids if they are tender and swollen. Using ice packs between sitz baths may be helpful.   Put ice in a plastic bag.   Place a towel between your skin and the bag.   Leave the ice on for 15 20 minutes, 3 4 times a day.   Do not use a donut-shaped pillow or sit on the toilet for long periods. This increases blood pooling and pain.  SEEK MEDICAL CARE IF:  You have increasing pain and swelling that is not controlled by treatment or medicine.  You have uncontrolled bleeding.  You have difficulty or you are unable to have a bowel movement.  You have pain or inflammation outside the area of the hemorrhoids. MAKE SURE YOU:    Understand these instructions.  Will watch your condition.  Will get help right away if you are not doing well or get worse. Document Released: 02/04/2000 Document Revised: 01/24/2012 Document Reviewed: 12/12/2011 Exit[employer] Patient Information 2014 [employer]. Sitz Bath A sitz bath is a warm water bath taken in the sitting position that covers only the hips and buttocks. It may be used for either healing or hygiene purposes. Sitz baths are also used to relieve pain, itching, or muscle spasms. The water may contain  medicine. Moist heat will help you heal and relax.  HOME CARE INSTRUCTIONS  Take 3 to 4 sitz baths a day. 6. Fill the bathtub half full with warm water. 7. Sit in the water and open the drain a little. 8. Turn on the warm water to keep the tub half full. Keep the water running constantly. 9. Soak in the water for 15 to 20 minutes. 10. After the sitz bath, pat the affected area dry first. SEEK MEDICAL CARE IF:  You get worse instead of better. Stop the sitz baths if you get worse. MAKE SURE YOU:  Understand these instructions.  Will watch your condition.  Will get help right away if you are not doing well or get worse. Document Released: 10/30/2003 Document Revised: 11/01/2011 Document Reviewed: 05/06/2010 [employer] Patient Information 2014 [employer].  

## 2013-01-06 NOTE — Assessment & Plan Note (Signed)
Patient has grade 3 hemorrhoids from which she is intermittently symptomatic.  Indications for treatment of hemorrhoids beyond medical therapy were explained to the patient including symptoms refractory to medical therapy, or recurrence of symptoms.  Therapies including banding and surgical hemorrhoidectomy were discussed.  At this point, since symptoms are relatively mild and intermittent I recommended that she treat herself expectantly with suppositories and that she try to maintain regular bowel movements.

## 2013-01-06 NOTE — Progress Notes (Signed)
History of Present Illness: 52 year old white female here for evaluation of hemorrhoids.  Screening colonoscopy demonstrated a small adenomatous polyp and internal hemorrhoids.  Over the years the patient has had hemorrhoidal symptoms consisting of pain and protrusion, especially when she's constipated.  Rarely has she seen blood on the toilet tissue or coating the stool.  She's used suppositories with improvement.  With straining she may have protrusion of hemorrhoids that  reduce spontaneously.    Past Medical History  Diagnosis Date  . Hypertension   . Hydatid cyst   . Hemorrhoid    Past Surgical History  Procedure Laterality Date  . 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 Nsvd      X 2  . Btl  1991  . Cholecystectomy  1994  . Vaginal hysterectomy  2004    heavy bleeding, ovaries remain  . Hydatid cyst      Left  . Colonoscopy  11/2012    tubular adenoma, mod diverticulosis, int hem, rpt 5 yrs Arlyce Dice)   family history includes Cancer in her mother; Diabetes in her mother; Hypertension in her brother and father; Stomach cancer in her mother; Stroke (age of onset: 41) in her father; Stroke (age of onset: 31) in her mother. There is no history of Coronary artery disease, Colon cancer, Rectal cancer, or Esophageal cancer. Current Outpatient Prescriptions  Medication Sig Dispense Refill  . lisinopril-hydrochlorothiazide (PRINZIDE,ZESTORETIC) 10-12.5 MG per tablet TAKE 1 TABLET BY MOUTH DAILY.  90 tablet  3   No current facility-administered medications for this visit.   Allergies as of 01/06/2013  . (No Known Allergies)    reports that she quit smoking about 30 years ago. Her smoking use included Cigarettes. She smoked 0.00 packs per day. She has never used smokeless tobacco. She reports that she drinks about 1.0 ounces of alcohol per week. She reports that she does not use illicit  drugs.     Review of Systems: Pertinent positive and negative review of systems were noted in the above HPI section. All other review of systems were otherwise negative.  Vital signs were reviewed in today's medical record Physical Exam: General: Well developed , well nourished, no acute distress Skin: anicteric Head: Normocephalic and atraumatic Eyes:  sclerae anicteric, EOMI Ears: Normal auditory acuity Mouth: No deformity or lesions Neck: Supple, no masses or thyromegaly Lungs: Clear throughout to auscultation Heart: Regular rate and rhythm; no murmurs, rubs or bruits Abdomen: Soft, non tender and non distended. No masses, hepatosplenomegaly or hernias noted. Normal Bowel sounds Rectal: Grade 3 hemorrhoids and skin tags are present Musculoskeletal: Symmetrical with no gross deformities  Skin: No lesions on visible extremities Pulses:  Normal pulses noted Extremities: No clubbing, cyanosis, edema or deformities noted Neurological: Alert oriented x 4, grossly nonfocal Cervical Nodes:  No significant cervical adenopathy Inguinal Nodes: No significant inguinal adenopathy Psychological:  Alert and cooperative. Normal mood and affect

## 2013-02-17 ENCOUNTER — Telehealth: Payer: Self-pay | Admitting: Family Medicine

## 2013-02-17 NOTE — Telephone Encounter (Signed)
Pt is calling and has cold sore. She is wondering if Valtrex could be called into [facility] on [address] in [place].  She says the last time she got one she got prescribed only 1 tablet but she ask if she could get a few more this time incase another comes on ??

## 2013-05-21 ENCOUNTER — Other Ambulatory Visit: Payer: Self-pay

## 2013-05-21 DIAGNOSIS — Z1231 Encounter for screening mammogram for malignant neoplasm of breast: Secondary | ICD-10-CM

## 2013-05-30 ENCOUNTER — Ambulatory Visit: Payer: 59

## 2013-06-11 ENCOUNTER — Ambulatory Visit
Admission: RE | Admit: 2013-06-11 | Discharge: 2013-06-11 | Disposition: A | Discharge status: Home / Self Care | Payer: Self-pay | Source: Ambulatory Visit

## 2013-06-11 ENCOUNTER — Encounter (INDEPENDENT_AMBULATORY_CARE_PROVIDER_SITE_OTHER): Payer: Self-pay

## 2013-06-11 DIAGNOSIS — Z1231 Encounter for screening mammogram for malignant neoplasm of breast: Secondary | ICD-10-CM

## 2013-07-30 ENCOUNTER — Ambulatory Visit (INDEPENDENT_AMBULATORY_CARE_PROVIDER_SITE_OTHER): Payer: 59 | Admitting: Gynecology

## 2013-07-30 ENCOUNTER — Encounter: Payer: Self-pay | Admitting: Gynecology

## 2013-07-30 ENCOUNTER — Other Ambulatory Visit (HOSPITAL_COMMUNITY)
Admission: RE | Admit: 2013-07-30 | Discharge: 2013-07-30 | Disposition: A | Discharge status: Home / Self Care | Payer: 59 | Source: Ambulatory Visit | Attending: Gynecology | Admitting: Gynecology

## 2013-07-30 VITALS — BP 130/80 | Ht 66.0 in | Wt 168.0 lb

## 2013-07-30 DIAGNOSIS — Z01419 Encounter for gynecological examination (general) (routine) without abnormal findings: Secondary | ICD-10-CM

## 2013-07-30 NOTE — Patient Instructions (Signed)
Office will call you to arrange appointment about your hemorrhoids.  Followup in 1 year, sooner as needed.  You may obtain a copy of any labs that were done today by logging onto MyChart as outlined in the instructions provided with your AVS (after visit summary). The office will not call with normal lab results but certainly if there are any significant abnormalities then we will contact you.   Health Maintenance, Female A healthy lifestyle and preventative care can promote health and wellness.  Maintain regular health, dental, and eye exams.  Eat a healthy diet. Foods like vegetables, fruits, whole grains, low-fat dairy products, and lean protein foods contain the nutrients you need without too many calories. Decrease your intake of foods high in solid fats, added sugars, and salt. Get information about a proper diet from your caregiver, if necessary.  Regular physical exercise is one of the most important things you can do for your health. Most adults should get at least 150 minutes of moderate-intensity exercise (any activity that increases your heart rate and causes you to sweat) each week. In addition, most adults need muscle-strengthening exercises on 2 or more days a week.   Maintain a healthy weight. The body mass index (BMI) is a screening tool to identify possible weight problems. It provides an estimate of body fat based on height and weight. Your caregiver can help determine your BMI, and can help you achieve or maintain a healthy weight. For adults 20 years and older:  A BMI below 18.5 is considered underweight.  A BMI of 18.5 to 24.9 is normal.  A BMI of 25 to 29.9 is considered overweight.  A BMI of 30 and above is considered obese.  Maintain normal blood lipids and cholesterol by exercising and minimizing your intake of saturated fat. Eat a balanced diet with plenty of fruits and vegetables. Blood tests for lipids and cholesterol should begin at age 42 and be repeated every  5 years. If your lipid or cholesterol levels are high, you are over 50, or you are a high risk for heart disease, you may need your cholesterol levels checked more frequently.Ongoing high lipid and cholesterol levels should be treated with medicines if diet and exercise are not effective.  If you smoke, find out from your caregiver how to quit. If you do not use tobacco, do not start.  Lung cancer screening is recommended for adults aged 16 80 years who are at high risk for developing lung cancer because of a history of smoking. Yearly low-dose computed tomography (CT) is recommended for people who have at least a 30-pack-year history of smoking and are a current smoker or have quit within the past 15 years. A pack year of smoking is smoking an average of 1 pack of cigarettes a day for 1 year (for example: 1 pack a day for 30 years or 2 packs a day for 15 years). Yearly screening should continue until the smoker has stopped smoking for at least 15 years. Yearly screening should also be stopped for people who develop a health problem that would prevent them from having lung cancer treatment.  If you are pregnant, do not drink alcohol. If you are breastfeeding, be very cautious about drinking alcohol. If you are not pregnant and choose to drink alcohol, do not exceed 1 drink per day. One drink is considered to be 12 ounces (355 mL) of beer, 5 ounces (148 mL) of wine, or 1.5 ounces (44 mL) of liquor.  Avoid use of  street drugs. Do not share needles with anyone. Ask for help if you need support or instructions about stopping the use of drugs.  High blood pressure causes heart disease and increases the risk of stroke. Blood pressure should be checked at least every 1 to 2 years. Ongoing high blood pressure should be treated with medicines, if weight loss and exercise are not effective.  If you are 55 to 51 years old, ask your caregiver if you should take aspirin to prevent strokes.  Diabetes screening  involves taking a blood sample to check your fasting blood sugar level. This should be done once every 3 years, after age 45, if you are within normal weight and without risk factors for diabetes. Testing should be considered at a younger age or be carried out more frequently if you are overweight and have at least 1 risk factor for diabetes.  Breast cancer screening is essential preventative care for women. You should practice "breast self-awareness." This means understanding the normal appearance and feel of your breasts and may include breast self-examination. Any changes detected, no matter how small, should be reported to a caregiver. Women in their 20s and 30s should have a clinical breast exam (CBE) by a caregiver as part of a regular health exam every 1 to 3 years. After age 40, women should have a CBE every year. Starting at age 40, women should consider having a mammogram (breast X-ray) every year. Women who have a family history of breast cancer should talk to their caregiver about genetic screening. Women at a high risk of breast cancer should talk to their caregiver about having an MRI and a mammogram every year.  Breast cancer gene (BRCA)-related cancer risk assessment is recommended for women who have family members with BRCA-related cancers. BRCA-related cancers include breast, ovarian, tubal, and peritoneal cancers. Having family members with these cancers may be associated with an increased risk for harmful changes (mutations) in the breast cancer genes BRCA1 and BRCA2. Results of the assessment will determine the need for genetic counseling and BRCA1 and BRCA2 testing.  The Pap test is a screening test for cervical cancer. Women should have a Pap test starting at age 21. Between ages 21 and 29, Pap tests should be repeated every 2 years. Beginning at age 30, you should have a Pap test every 3 years as long as the past 3 Pap tests have been normal. If you had a hysterectomy for a problem that  was not cancer or a condition that could lead to cancer, then you no longer need Pap tests. If you are between ages 65 and 70, and you have had normal Pap tests going back 10 years, you no longer need Pap tests. If you have had past treatment for cervical cancer or a condition that could lead to cancer, you need Pap tests and screening for cancer for at least 20 years after your treatment. If Pap tests have been discontinued, risk factors (such as a new sexual partner) need to be reassessed to determine if screening should be resumed. Some women have medical problems that increase the chance of getting cervical cancer. In these cases, your caregiver may recommend more frequent screening and Pap tests.  The human papillomavirus (HPV) test is an additional test that may be used for cervical cancer screening. The HPV test looks for the virus that can cause the cell changes on the cervix. The cells collected during the Pap test can be tested for HPV. The HPV test could   be used to screen women aged 67 years and older, and should be used in women of any age who have unclear Pap test results. After the age of 12, women should have HPV testing at the same frequency as a Pap test.  Colorectal cancer can be detected and often prevented. Most routine colorectal cancer screening begins at the age of 26 and continues through age 56. However, your caregiver may recommend screening at an earlier age if you have risk factors for colon cancer. On a yearly basis, your caregiver may provide home test kits to check for hidden blood in the stool. Use of a small camera at the end of a tube, to directly examine the colon (sigmoidoscopy or colonoscopy), can detect the earliest forms of colorectal cancer. Talk to your caregiver about this at age 6, when routine screening begins. Direct examination of the colon should be repeated every 5 to 10 years through age 67, unless early forms of pre-cancerous polyps or small growths are  found.  Hepatitis C blood testing is recommended for all people born from [birth year] through 1963 and any individual with known risks for hepatitis C.  Practice safe sex. Use condoms and avoid high-risk sexual practices to reduce the spread of sexually transmitted infections (STIs). Sexually active women aged 8 and younger should be checked for Chlamydia, which is a common sexually transmitted infection. Older women with new or multiple partners should also be tested for Chlamydia. Testing for other STIs is recommended if you are sexually active and at increased risk.  Osteoporosis is a disease in which the bones lose minerals and strength with aging. This can result in serious bone fractures. The risk of osteoporosis can be identified using a bone density scan. Women ages 69 and over and women at risk for fractures or osteoporosis should discuss screening with their caregivers. Ask your caregiver whether you should be taking a calcium supplement or vitamin D to reduce the rate of osteoporosis.  Menopause can be associated with physical symptoms and risks. Hormone replacement therapy is available to decrease symptoms and risks. You should talk to your caregiver about whether hormone replacement therapy is right for you.  Use sunscreen. Apply sunscreen liberally and repeatedly throughout the day. You should seek shade when your shadow is shorter than you. Protect yourself by wearing long sleeves, pants, a wide-brimmed hat, and sunglasses year round, whenever you are outdoors.  Notify your caregiver of new moles or changes in moles, especially if there is a change in shape or color. Also notify your caregiver if a mole is larger than the size of a pencil eraser.  Stay current with your immunizations. Document Released: 08/22/2010 Document Revised: 06/03/2012 Document Reviewed: 08/22/2010 [facility] Patient Information 2014 [place].

## 2013-07-30 NOTE — Progress Notes (Signed)
ATLAS KUC 15-May-1960 [id number]        53 y.o.  [id number] for annual exam.  Former patient of Dr. Cherylann Banas. Overall doing well.  Past medical history,surgical history, problem list, medications, allergies, family history and social history were all reviewed and documented as reviewed in the EPIC chart.  ROS:  12 system ROS performed with pertinent positives and negatives included in the history, assessment and plan.  Included Systems: General, HEENT, Neck, Cardiovascular, Pulmonary, Gastrointestinal, Genitourinary, Musculoskeletal, Dermatologic, Endocrine, Hematological, Neurologic, Psychiatric Additional significant findings :  None   Exam: Kim assistant Filed Vitals:   07/30/13 0916  BP: 130/80  Height: 5\' 6"  (1.676 m)  Weight: 168 lb (76.204 kg)   General appearance:  Normal affect, orientation and appearance. Skin: Grossly normal HEENT: Without gross lesions.  No cervical or supraclavicular adenopathy. Thyroid normal.  Lungs:  Clear without wheezing, rales or rhonchi Cardiac: RR, without RMG Abdominal:  Soft, nontender, without masses, guarding, rebound, organomegaly or hernia Breasts:  Examined lying and sitting without masses, retractions, discharge or axillary adenopathy. Pelvic:  Ext/BUS/vagina normal. Pap of cuff done.   Adnexa  Without masses or tenderness    Anus and perineum  with large hemorrhoids   Rectovaginal  Normal sphincter tone without palpated masses or tenderness.    Assessment/Plan:  53 y.o. [id number] female for annual exam.   1. Status post TVH 2004 for bleeding/leiomyoma. Doing well without menopausal symptoms. Had [facility] checked at her primary and was told it was normal. Continue to monitor and report any significant symptoms. 2. Hemorrhoids. Patient has fairly large hemorrhoids and has had these for years. They were becoming a hygienic issue and have flares that are bothersome to the patient. Will refer to surgeon for discussion about  hemorrhoidectomy. 3. Mammography 05/2013. Continue with annual mammography. SBE monthly reviewed. 4. Colonoscopy 2014. Repeat at their recommended interval. 5. Pap smear 2012. Pap of cuff done. No history of abnormal Pap smears. Status post hysterectomy for benign indication with pathology showing a normal cervix. Options to stop screening altogether were less frequent screening intervals reviewed. At this point we'll plan less frequent screening interval and readdressed on an annual basis. 6. Health maintenance. Patient reports all routine blood work done through her primary physician's office who follows her for her hypertension. Followup one year, sooner as needed.   Note: This document was prepared with digital dictation and possible smart phrase technology. Any transcriptional errors that result from this process are unintentional.   Anastasio Auerbach MD, 9:44 AM 07/30/2013

## 2013-07-30 NOTE — Addendum Note (Signed)
Addended by: Nelva Nay on: 07/30/2013 09:54 AM   Modules accepted: Orders

## 2013-07-31 LAB — URINALYSIS W MICROSCOPIC + REFLEX CULTURE
BILIRUBIN URINE: NEGATIVE
CASTS: NONE SEEN
Glucose, UA: NEGATIVE mg/dL
HGB URINE DIPSTICK: NEGATIVE
KETONES UR: NEGATIVE mg/dL
Leukocytes, UA: NEGATIVE
Nitrite: NEGATIVE
PH: 7 (ref 5.0–8.0)
Protein, ur: NEGATIVE mg/dL
SPECIFIC GRAVITY, URINE: 1.023 (ref 1.005–1.030)
Urobilinogen, UA: 1 mg/dL (ref 0.0–1.0)

## 2013-07-31 LAB — CYTOLOGY - PAP

## 2013-08-01 ENCOUNTER — Telehealth: Payer: Self-pay | Admitting: *Deleted

## 2013-08-01 DIAGNOSIS — K649 Unspecified hemorrhoids: Secondary | ICD-10-CM

## 2013-08-01 LAB — URINE CULTURE
Colony Count: NO GROWTH
Organism ID, Bacteria: NO GROWTH

## 2013-08-01 NOTE — Telephone Encounter (Signed)
Message copied by Thamas Jaegers on Fri Aug 01, 2013  9:02 AM ------      Message from: Anastasio Auerbach      Created: Wed Jul 30, 2013 11:08 AM       Schedule appointment with general surgeon who does hemorrhoidectomies for consult reference significant hemorrhoids ------

## 2013-08-01 NOTE — Telephone Encounter (Signed)
Appointment 08/28/13 at 9:00 am with Dr.Martin, left message for pt to call.

## 2013-08-01 NOTE — Telephone Encounter (Signed)
Pt informed with the below note. 

## 2013-08-28 ENCOUNTER — Ambulatory Visit (INDEPENDENT_AMBULATORY_CARE_PROVIDER_SITE_OTHER): Payer: 59 | Admitting: Surgery

## 2013-08-28 ENCOUNTER — Encounter (INDEPENDENT_AMBULATORY_CARE_PROVIDER_SITE_OTHER): Payer: Self-pay | Admitting: Surgery

## 2013-08-28 VITALS — BP 126/76 | HR 87 | Temp 97.5°F | Ht 66.0 in | Wt 165.0 lb

## 2013-08-28 DIAGNOSIS — K649 Unspecified hemorrhoids: Secondary | ICD-10-CM | POA: Insufficient documentation

## 2013-08-28 DIAGNOSIS — K642 Third degree hemorrhoids: Secondary | ICD-10-CM

## 2013-08-28 NOTE — Patient Instructions (Signed)
Hemorrhoidectomy °Care After °Hemorrhoidectomy is the removal of enlarged (dilated) veins around the rectum. Until the surgical areas are healed, control of pain and avoiding constipation are the greatest challenges for patients.  °For as long as 24 hours after receiving an anesthetic (the medication that made you sleep), and while taking narcotic pain relievers, you may feel dizzy, weak and drowsy. For that reason, the following information applies to the first 24-hour period following surgery, and continues for as long as you are taking narcotic pain medications. °· Do not drive a car, ride a bicycle, participate in activities in which you could be hurt. Do not take public transportation until you are off narcotic pain medications and until your caregiver says it is okay. °· Do not drink alcohol, take tranquilizers, or medications not prescribed or allowed by your surgical caregiver. °· Do not sign important papers or contracts for at least 24 hours or while taking narcotic medications. °· Have a responsible person with you for 24 hours. °RISKS AND COMPLICATIONS °Some problems that may occur following this procedure include: °· Infection. A germ starts growing in the tissue surrounding the site operated on. This can usually be treated with antibiotics. °· Damage to the rectal sphincter could occur. This is the muscle that opens in your anus to allow a bowel movement. This could cause incontinence. This is uncommon. °· Bleeding following surgery can be a complication of almost any surgery. Your surgeon takes every precaution to keep this from happening. °· Complications of anesthesia. °HOME CARE INSTRUCTIONS °· Avoid straining when having bowel movements. °· Avoid heavy lifting (more than 10 pounds (4.5 kilograms)). °· Only take over-the-counter or prescription medicines for pain, discomfort, or fever as directed by your caregiver. °· Take hot sitz baths for 20 to 30 minutes, 3 to 4 times per day. °· To keep  swelling down, apply an ice pack for twenty minutes three to four times per day between sitz baths. Use a towel between your skin and the ice pack. Do not do this if it causes too much discomfort. °· Keep anal area clean and dry. Following a bowel movement, you can gently wash the area with tucks (available for purchase at a drugstore) or cotton swabs. Gently pat the area dry. Do not rub the area. °· Eat a well balanced diet and drink 6 to 8 glasses of water every day to avoid constipation. A bulk laxative may be also be helpful. °SEEK MEDICAL CARE IF:  °· You have increasing pain or tenderness near or in the surgical site. °· You are unable to eat or drink. °· You develop nausea or vomiting. °· You develop uncontrolled bleeding such as soaking two to three pads in one hour. °· You have constipation, not helped by changing your diet or increasing your fluid intake. Pain medications are a common cause of constipation. °· You have pain and redness (inflammation) extending outside the area of your surgery. °· You develop an unexplained oral temperature above 102° F (38.9° C), or any other signs of infection. °· You have any other questions or concerns following surgery. °Document Released: 04/29/2003 Document Revised: 05/01/2011 Document Reviewed: 07/27/2008 °ExitCare® Patient Information ©2015 [employer]. This information is not intended to replace advice given to you by your health care provider. Make sure you discuss any questions you have with your health care provider. ° °

## 2013-08-28 NOTE — Progress Notes (Signed)
Chief Complaint:  Longstanding history of chronic recurrent hemorrhoidal pain, bleeding and pruritus  History of Present Illness:  Stacey Bean is an 53 y.o. female referred by Dr. Phineas Real with chronic hemorrhoidal disease.  This has been noted at the time of her colonoscopy by [place] et al.  Following the examination we discussed hemorrhoidectomy in some detail including pain after surgery. She is aware of this would like to go ahead and have something done about her hemorrhoids. We'll go ahead and see to schedule her for exam under anesthesia with possible banding and/or internal hemorrhoidectomy and external hemorrhoidectomy for large skin tags.  Past Medical History  Diagnosis Date  . Hypertension   . Hemorrhoid     Past Surgical History  Procedure Laterality Date  . 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 Nsvd      X 2  . Btl  1991  . Cholecystectomy  1994  . Vaginal hysterectomy  2004    heavy bleeding, ovaries remain  . Hydatid cyst  2004    Left at time of hysterectomy  . Colonoscopy  11/2012    tubular adenoma, mod diverticulosis, int hem, rpt 5 yrs Deatra Ina)    Current Outpatient Prescriptions  Medication Sig Dispense Refill  . lisinopril-hydrochlorothiazide (PRINZIDE,ZESTORETIC) 10-12.5 MG per tablet TAKE 1 TABLET BY MOUTH DAILY.  90 tablet  3   No current facility-administered medications for this visit.   Review of patient's allergies indicates no known allergies. Family History  Problem Relation Age of Onset  . Diabetes Mother   . Cancer Mother     Melanoma and stomach cancer  . Stroke Mother 65    massive CVA  . Stomach cancer Mother   . Hypertension Father   . Stroke Father 71  . Coronary artery disease Neg Hx   . Colon cancer Neg Hx   . Rectal cancer Neg Hx   . Esophageal cancer Neg Hx   . Hypertension Brother    Social History:   reports that she  quit smoking about 31 years ago. Her smoking use included Cigarettes. She smoked 0.00 packs per day. She has never used smokeless tobacco. She reports that she drinks about one ounce of alcohol per week. She reports that she does not use illicit drugs.   REVIEW OF SYSTEMS : Positive for negative ; otherwise negative  Physical Exam:   Blood pressure 126/76, pulse 87, temperature 97.5 F (36.4 C), height 5\' 6"  (1.676 m), weight 165 lb (74.844 kg). Body mass index is 26.64 kg/(m^2).  Gen:  WDWN white female NAD  Neurological: Alert and oriented to person, place, and time. Motor and sensory function is grossly intact  Head: Normocephalic and atraumatic.  Eyes: Conjunctivae are normal. Pupils are equal, round, and reactive to light. No scleral icterus.  Neck: Normal range of motion. Neck supple. No tracheal deviation or thyromegaly present.  Cardiovascular:  SR without murmurs or gallops.  No carotid bruits Breast:  Not examined Respiratory: Effort normal.  No respiratory distress. No chest wall tenderness. Breath sounds normal.  No wheezes, rales or rhonchi.  Abdomen:  nontender GU:  Rectal exam showed external hemorrhoidal columns worse on the left than the right. Internal exam showed internal hemorrhoids that were discolored and purple. No fissure was noted. With Valsalva unsure she has prolapse of the internals contributing to her bleeding. Musculoskeletal: Normal range of motion.  Extremities are nontender. No cyanosis, edema or clubbing noted Lymphadenopathy: No cervical, preauricular, postauricular or axillary adenopathy is present Skin: Skin is warm and dry. No rash noted. No diaphoresis. No erythema. No pallor. Pscyh: Normal mood and affect. Behavior is normal. Judgment and thought content normal.   LABORATORY RESULTS: No results found for this or any previous visit (from the past 48 hour(s)).   RADIOLOGY RESULTS: No results found.  Problem List: Patient Active Problem List    Diagnosis Date Noted  . Internal hemorrhoids with other complication 17/79/3903  . Benign neoplasm of colon 01/06/2013  . Healthcare maintenance 09/02/2012  . Perimenopausal 09/02/2012  . Menometrorrhagia   . Contact dermatitis 11/01/2010  . Fever blister 11/01/2010  . HYPERTENSION 01/09/2008  . FIBROMYALGIA 07/31/2007    Assessment & Plan: I discussed hemorrhoidectomy with her and she was wouldn't proceed with this. We'll consider banding when appropriate and internal hemorrhoidectomy as well as external hemorrhoidectomy. 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  Will proceed with outpatient hemorroidectomy.      Matt B. Hassell Done, MD, [facility] [phone] beeper [phone]  08/28/2013 9:56 AM

## 2013-09-04 ENCOUNTER — Ambulatory Visit (INDEPENDENT_AMBULATORY_CARE_PROVIDER_SITE_OTHER): Payer: 59 | Admitting: Family Medicine

## 2013-09-04 ENCOUNTER — Encounter: Payer: Self-pay | Admitting: Family Medicine

## 2013-09-04 VITALS — BP 130/84 | HR 76 | Temp 98.1°F | Ht 66.0 in | Wt 163.2 lb

## 2013-09-04 DIAGNOSIS — Z0000 Encounter for general adult medical examination without abnormal findings: Secondary | ICD-10-CM

## 2013-09-04 DIAGNOSIS — I10 Essential (primary) hypertension: Secondary | ICD-10-CM

## 2013-09-04 MED ORDER — LISINOPRIL-HYDROCHLOROTHIAZIDE 10-12.5 MG PO TABS: ORAL_TABLET | ORAL | Status: DC

## 2013-09-04 NOTE — Assessment & Plan Note (Addendum)
Chronic, stable. Continue med. Refilled today. Check BMP today.

## 2013-09-04 NOTE — Progress Notes (Signed)
BP 130/84  Pulse 76  Temp(Src) 98.1 F (36.7 C) (Oral)  Ht 5\' 6"  (1.676 m)  Wt 163 lb 4 oz (74.05 kg)  BMI 26.36 kg/m2   CC: CPE  Subjective:    Patient ID: Stacey Bean, female    DOB: November 17, 1960, 53 y.o.   MRN: [id number]  HPI: Stacey Bean is a 53 y.o. female presenting on 09/04/2013 for Annual Exam   Sunscreen use discussed, no sunburns recently. Did have skin cancer screening. Seat belt use discussed.   Preventative: COLONOSCOPY Date: 11/2012 tubular adenoma, mod diverticulosis, int hem, rpt 5 yrs Deatra Ina) Mammogram - 06/2013 WNL  Well woman - with OBGYN Dr. Phineas Real Td 2009  Flu shot - yearly.  Caffeine: 2 cups coffee Widowed , husband died at age 19 of colon cancer.  Activity: treadmill a few days a week 40 min, yardwork Diet: good water, daily fruits/vegetables.   Relevant past medical, surgical, family and social history reviewed and updated as indicated.  Allergies and medications reviewed and updated. No current outpatient prescriptions on file prior to visit.   No current facility-administered medications on file prior to visit.    Review of Systems  Constitutional: Negative for fever, chills, activity change, appetite change, fatigue and unexpected weight change.  HENT: Negative for hearing loss.   Eyes: Negative for visual disturbance.  Respiratory: Negative for cough, chest tightness, shortness of breath and wheezing.   Cardiovascular: Negative for chest pain, palpitations and leg swelling.  Gastrointestinal: Negative for nausea, vomiting, abdominal pain, diarrhea, constipation, blood in stool and abdominal distention.  Genitourinary: Negative for hematuria and difficulty urinating.  Musculoskeletal: Negative for arthralgias, myalgias and neck pain.  Skin: Negative for rash.  Neurological: Negative for dizziness, seizures, syncope and headaches.  Hematological: Negative for adenopathy. Does not bruise/bleed easily.  Psychiatric/Behavioral: Negative  for dysphoric mood. The patient is not nervous/anxious.    Per HPI unless specifically indicated above    Objective:    BP 130/84  Pulse 76  Temp(Src) 98.1 F (36.7 C) (Oral)  Ht 5\' 6"  (1.676 m)  Wt 163 lb 4 oz (74.05 kg)  BMI 26.36 kg/m2  Physical Exam  Nursing note and vitals reviewed. Constitutional: She is oriented to person, place, and time. She appears well-developed and well-nourished. No distress.  HENT:  Head: Normocephalic and atraumatic.  Right Ear: Hearing, tympanic membrane, external ear and ear canal normal.  Left Ear: Hearing, tympanic membrane, external ear and ear canal normal.  Nose: Nose normal.  Mouth/Throat: Uvula is midline, oropharynx is clear and moist and mucous membranes are normal. No oropharyngeal exudate, posterior oropharyngeal edema or posterior oropharyngeal erythema.  Eyes: Conjunctivae and EOM are normal. Pupils are equal, round, and reactive to light. No scleral icterus.  Neck: Normal range of motion. Neck supple. No thyromegaly present.  Cardiovascular: Normal rate, regular rhythm, normal heart sounds and intact distal pulses.   No murmur heard. Pulses:      Radial pulses are 2+ on the right side, and 2+ on the left side.  Pulmonary/Chest: Effort normal and breath sounds normal. No respiratory distress. She has no wheezes. She has no rales.  Breast exam deferred per OBGYN  Abdominal: Soft. Bowel sounds are normal. She exhibits no distension and no mass. There is no tenderness. There is no rebound and no guarding.  Genitourinary:  Deferred per OBGYN  Musculoskeletal: Normal range of motion. She exhibits no edema.  Lymphadenopathy:    She has no cervical adenopathy.  Neurological: She  is alert and oriented to person, place, and time.  CN grossly intact, station and gait intact  Skin: Skin is warm and dry. No rash noted.  Psychiatric: She has a normal mood and affect. Her behavior is normal. Judgment and thought content normal.         Assessment & Plan:   Problem List Items Addressed This Visit   HYPERTENSION     Chronic, stable. Continue med. Refilled today. Check BMP today.    Relevant Medications      lisinopril-hydrochlorothiazide (PRINZIDE,ZESTORETIC) 10-12.5 MG per tablet   Other Relevant Orders      Basic metabolic panel   Healthcare maintenance - Primary     Preventative protocols reviewed and updated unless pt declined. Discussed healthy diet and lifestyle.        Follow up plan: Return in about 1 year (around 09/05/2014), or as needed, for physical.

## 2013-09-04 NOTE — Patient Instructions (Signed)
You are doing well. Good to see you today, call us with questions. Return in 1 year for next visit.

## 2013-09-04 NOTE — Progress Notes (Signed)
Pre visit review using our clinic review tool, if applicable. No additional management support is needed unless otherwise documented below in the visit note. 

## 2013-09-04 NOTE — Assessment & Plan Note (Signed)
Preventative protocols reviewed and updated unless pt declined. Discussed healthy diet and lifestyle.  

## 2013-09-05 LAB — BASIC METABOLIC PANEL
BUN: 10 mg/dL (ref 6–23)
CALCIUM: 9.4 mg/dL (ref 8.4–10.5)
CO2: 28 mEq/L (ref 19–32)
Chloride: 103 mEq/L (ref 96–112)
Creatinine, Ser: 0.8 mg/dL (ref 0.4–1.2)
GFR: 82.76 mL/min (ref >60.00)
Glucose, Bld: 83 mg/dL (ref 70–99)
Potassium: 3.7 mEq/L (ref 3.5–5.1)
SODIUM: 139 meq/L (ref 135–145)

## 2013-09-18 ENCOUNTER — Encounter (HOSPITAL_COMMUNITY): Payer: Self-pay | Admitting: Pharmacy Technician

## 2013-09-19 SURGERY — Surgical Case
Anesthesia: *Unknown

## 2013-09-20 HISTORY — PX: HEMORRHOID BANDING: SHX5850

## 2013-09-24 NOTE — Patient Instructions (Signed)
Stacey Bean  09/24/2013                           YOUR PROCEDURE IS SCHEDULED ON: 10/01/13               ENTER THRU [facility] MAIN HOSPITAL ENTRANCE AND                            FOLLOW  SIGNS TO SHORT STAY CENTER                 ARRIVE AT SHORT STAY AT: 6:30 AM               CALL THIS NUMBER IF ANY PROBLEMS THE DAY OF SURGERY :               [phone]                                REMEMBER:   Do not eat food or drink liquids AFTER MIDNIGHT             USE FLEET ENEMA THE NIGHT BEFORE SURGERY AND THE MORNING OF SURGERY BEFORE COMING TO HOSPITAL                  Take these medicines the morning of surgery with               A SIPS OF WATER :  NONE               DISCONTINUE ASPIRIN AND HERBAL MEDS 5 DAYS PREOP      Do not wear jewelry, make-up   Do not wear lotions, powders, or perfumes.   Do not shave legs or underarms 12 hrs. before surgery (men may shave face)  Do not bring valuables to the hospital.  Contacts, dentures or bridgework may not be worn into surgery.  Leave suitcase in the car. After surgery it may be brought to your room.  For patients admitted to the hospital more than one night, checkout time is            11:00 AM                                                       The day of discharge.   Patients discharged the day of surgery will not be allowed to drive home.            If going home same day of surgery, must have someone stay with you              FIRST 24 hrs at home and arrange for some one to drive you              home from hospital.   ________________________________________________________________________  [facility]  Before surgery, you can play an important role.  Because skin is not sterile, your skin needs to be as free of germs as possible.  You can reduce the number of germs on your skin by washing with CHG (chlorahexidine  gluconate) soap before surgery.  CHG is an antiseptic cleaner which kills germs and bonds with the skin to continue killing germs even after washing. Please DO NOT use if you have an allergy to CHG or antibacterial soaps.  If your skin becomes reddened/irritated stop using the CHG and inform your nurse when you arrive at Short Stay. Do not shave (including legs and underarms) for at least 48 hours prior to the first CHG shower.  You may shave your face. Please follow these instructions carefully:   1.  Shower with CHG Soap the night before surgery and the  morning of Surgery.   2.  If you choose to wash your hair, wash your hair first as usual with your  normal  Shampoo.   3.  After you shampoo, rinse your hair and body thoroughly to remove the  shampoo.                                         4.  Use CHG as you would any other liquid soap.  You can apply chg directly  to the skin and wash . Gently wash with scrungie or clean wascloth    5.  Apply the CHG Soap to your body ONLY FROM THE NECK DOWN.   Do not use on open                           Wound or open sores. Avoid contact with eyes, ears mouth and genitals (private parts).                        Genitals (private parts) with your normal soap.              6.  Wash thoroughly, paying special attention to the area where your surgery  will be performed.   7.  Thoroughly rinse your body with warm water from the neck down.   8.  DO NOT shower/wash with your normal soap after using and rinsing off  the CHG Soap .                9.  Pat yourself dry with a clean towel.             10.  Wear clean pajamas.             11.  Place clean sheets on your bed the night of your first shower and do not  sleep with pets.  Day of Surgery : Do not apply any lotions/deodorants the morning of surgery.  Please wear clean clothes to the hospital/surgery center.  FAILURE TO FOLLOW THESE INSTRUCTIONS MAY RESULT IN THE CANCELLATION OF YOUR  SURGERY    PATIENT SIGNATURE_________________________________  ______________________________________________________________________

## 2013-09-25 ENCOUNTER — Encounter (HOSPITAL_COMMUNITY)
Admission: RE | Admit: 2013-09-25 | Discharge: 2013-09-25 | Disposition: A | Discharge status: Home / Self Care | Payer: 59 | Source: Ambulatory Visit | Attending: Surgery | Admitting: Surgery

## 2013-09-25 ENCOUNTER — Ambulatory Visit (HOSPITAL_COMMUNITY)
Admission: RE | Admit: 2013-09-25 | Discharge: 2013-09-25 | Disposition: A | Discharge status: Home / Self Care | Payer: 59 | Source: Ambulatory Visit | Attending: Anesthesiology | Admitting: Anesthesiology

## 2013-09-25 ENCOUNTER — Encounter (HOSPITAL_COMMUNITY): Payer: Self-pay

## 2013-09-25 DIAGNOSIS — Z01818 Encounter for other preprocedural examination: Secondary | ICD-10-CM | POA: Diagnosis present

## 2013-09-25 DIAGNOSIS — Z01812 Encounter for preprocedural laboratory examination: Secondary | ICD-10-CM | POA: Insufficient documentation

## 2013-09-25 DIAGNOSIS — Z01810 Encounter for preprocedural cardiovascular examination: Secondary | ICD-10-CM | POA: Insufficient documentation

## 2013-09-25 LAB — BASIC METABOLIC PANEL
Anion gap: 10 (ref 5–15)
BUN: 11 mg/dL (ref 6–23)
CALCIUM: 9.7 mg/dL (ref 8.4–10.5)
CO2: 28 mEq/L (ref 19–32)
Chloride: 101 mEq/L (ref 96–112)
Creatinine, Ser: 0.74 mg/dL (ref 0.50–1.10)
Glucose, Bld: 95 mg/dL (ref 70–99)
POTASSIUM: 4 meq/L (ref 3.7–5.3)
SODIUM: 139 meq/L (ref 137–147)

## 2013-09-25 LAB — CBC
HCT: 39.9 % (ref 36.0–46.0)
Hemoglobin: 13.2 g/dL (ref 12.0–15.0)
MCH: 29.3 pg (ref 26.0–34.0)
MCHC: 33.1 g/dL (ref 30.0–36.0)
MCV: 88.5 fL (ref 78.0–100.0)
PLATELETS: 340 10*3/uL (ref 150–400)
RBC: 4.51 MIL/uL (ref 3.87–5.11)
RDW: 12.2 % (ref 11.5–15.5)
WBC: 4.4 10*3/uL (ref 4.0–10.5)

## 2013-10-01 ENCOUNTER — Encounter (HOSPITAL_COMMUNITY)
Admission: RE | Disposition: A | Discharge status: Home / Self Care | Payer: Self-pay | Source: Ambulatory Visit | Attending: Surgery

## 2013-10-01 ENCOUNTER — Ambulatory Visit (HOSPITAL_COMMUNITY): Payer: 59 | Admitting: Anesthesiology

## 2013-10-01 ENCOUNTER — Encounter (HOSPITAL_COMMUNITY): Payer: Self-pay | Admitting: *Deleted

## 2013-10-01 ENCOUNTER — Ambulatory Visit (HOSPITAL_COMMUNITY)
Admission: RE | Admit: 2013-10-01 | Discharge: 2013-10-01 | Disposition: A | Discharge status: Home / Self Care | Payer: 59 | Source: Ambulatory Visit | Attending: Surgery | Admitting: Surgery

## 2013-10-01 ENCOUNTER — Encounter (HOSPITAL_COMMUNITY): Payer: 59 | Admitting: Anesthesiology

## 2013-10-01 DIAGNOSIS — K644 Residual hemorrhoidal skin tags: Secondary | ICD-10-CM | POA: Insufficient documentation

## 2013-10-01 DIAGNOSIS — I10 Essential (primary) hypertension: Secondary | ICD-10-CM | POA: Insufficient documentation

## 2013-10-01 DIAGNOSIS — IMO0001 Reserved for inherently not codable concepts without codable children: Secondary | ICD-10-CM | POA: Diagnosis not present

## 2013-10-01 DIAGNOSIS — K648 Other hemorrhoids: Secondary | ICD-10-CM | POA: Insufficient documentation

## 2013-10-01 HISTORY — PX: HEMORRHOID SURGERY: SHX153

## 2013-10-01 SURGERY — HEMORRHOIDECTOMY
Anesthesia: General | Site: Anus

## 2013-10-01 MED ORDER — ONDANSETRON HCL 4 MG/2ML IJ SOLN
INTRAMUSCULAR | Status: AC
  Filled 2013-10-01: qty 2

## 2013-10-01 MED ORDER — LACTATED RINGERS IV SOLN
INTRAVENOUS | Status: DC | PRN
  Administered 2013-10-01: 08:00:00 via INTRAVENOUS

## 2013-10-01 MED ORDER — LACTATED RINGERS IV SOLN: INTRAVENOUS | Status: DC

## 2013-10-01 MED ORDER — DEXTROSE 5 % IV SOLN
INTRAVENOUS | Status: AC
  Filled 2013-10-01: qty 2

## 2013-10-01 MED ORDER — ONDANSETRON HCL 4 MG/2ML IJ SOLN
INTRAMUSCULAR | Status: DC | PRN
  Administered 2013-10-01: 4 mg via INTRAVENOUS

## 2013-10-01 MED ORDER — DEXTROSE 5 % IV SOLN
2.0000 g | INTRAVENOUS | Status: AC
  Administered 2013-10-01: 2 g via INTRAVENOUS

## 2013-10-01 MED ORDER — BUPIVACAINE LIPOSOME 1.3 % IJ SUSP
20.0000 mL | Freq: Once | INTRAMUSCULAR | Status: AC
  Administered 2013-10-01: 20 mL
  Filled 2013-10-01: qty 20

## 2013-10-01 MED ORDER — SODIUM CHLORIDE 0.9 % IJ SOLN
INTRAMUSCULAR | Status: AC
  Filled 2013-10-01: qty 10

## 2013-10-01 MED ORDER — MIDAZOLAM HCL 2 MG/2ML IJ SOLN
INTRAMUSCULAR | Status: AC
  Filled 2013-10-01: qty 2

## 2013-10-01 MED ORDER — 0.9 % SODIUM CHLORIDE (POUR BTL) OPTIME
TOPICAL | Status: DC | PRN
  Administered 2013-10-01: 1000 mL

## 2013-10-01 MED ORDER — FENTANYL CITRATE 0.05 MG/ML IJ SOLN
INTRAMUSCULAR | Status: DC | PRN
  Administered 2013-10-01: 12.5 ug via INTRAVENOUS
  Administered 2013-10-01 (×3): 25 ug via INTRAVENOUS
  Administered 2013-10-01: 12.5 ug via INTRAVENOUS

## 2013-10-01 MED ORDER — FENTANYL CITRATE 0.05 MG/ML IJ SOLN
25.0000 ug | INTRAMUSCULAR | Status: DC | PRN
  Administered 2013-10-01 (×2): 50 ug via INTRAVENOUS

## 2013-10-01 MED ORDER — PROPOFOL 10 MG/ML IV BOLUS
INTRAVENOUS | Status: DC | PRN
  Administered 2013-10-01: 150 mg via INTRAVENOUS

## 2013-10-01 MED ORDER — LACTATED RINGERS IV SOLN
INTRAVENOUS | Status: DC
Start: 2013-10-01 — End: 2013-10-01

## 2013-10-01 MED ORDER — SODIUM CHLORIDE 0.9 % IJ SOLN: 3.0000 mL | INTRAMUSCULAR | Status: DC | PRN

## 2013-10-01 MED ORDER — OXYCODONE HCL 5 MG PO TABS
5.0000 mg | ORAL_TABLET | ORAL | Status: DC | PRN
  Administered 2013-10-01: 5 mg via ORAL
  Filled 2013-10-01: qty 1

## 2013-10-01 MED ORDER — FLEET ENEMA 7-19 GM/118ML RE ENEM: 1.0000 | ENEMA | Freq: Once | RECTAL | Status: DC

## 2013-10-01 MED ORDER — HEPARIN SODIUM (PORCINE) 5000 UNIT/ML IJ SOLN
5000.0000 [IU] | Freq: Once | INTRAMUSCULAR | Status: AC
  Administered 2013-10-01: 5000 [IU] via SUBCUTANEOUS
  Filled 2013-10-01: qty 1

## 2013-10-01 MED ORDER — FENTANYL CITRATE 0.05 MG/ML IJ SOLN
INTRAMUSCULAR | Status: AC
  Filled 2013-10-01: qty 2

## 2013-10-01 MED ORDER — HYDROCODONE-ACETAMINOPHEN 5-325 MG PO TABS: 1.0000 | ORAL_TABLET | ORAL | Status: DC | PRN

## 2013-10-01 MED ORDER — SODIUM CHLORIDE 0.9 % IV SOLN
250.0000 mL | INTRAVENOUS | Status: DC | PRN
Start: 2013-10-01 — End: 2013-10-01

## 2013-10-01 MED ORDER — ACETAMINOPHEN 650 MG RE SUPP
650.0000 mg | RECTAL | Status: DC | PRN
Start: 2013-10-01 — End: 2013-10-01
  Filled 2013-10-01: qty 1

## 2013-10-01 MED ORDER — MIDAZOLAM HCL 5 MG/5ML IJ SOLN
INTRAMUSCULAR | Status: DC | PRN
  Administered 2013-10-01: 2 mg via INTRAVENOUS

## 2013-10-01 MED ORDER — ACETAMINOPHEN 325 MG PO TABS: 650.0000 mg | ORAL_TABLET | ORAL | Status: DC | PRN

## 2013-10-01 MED ORDER — PROPOFOL 10 MG/ML IV BOLUS
INTRAVENOUS | Status: AC
  Filled 2013-10-01: qty 20

## 2013-10-01 MED ORDER — SODIUM CHLORIDE 0.9 % IJ SOLN: 3.0000 mL | Freq: Two times a day (BID) | INTRAMUSCULAR | Status: DC

## 2013-10-01 MED ORDER — CHLORHEXIDINE GLUCONATE 4 % EX LIQD: 1.0000 "application " | Freq: Once | CUTANEOUS | Status: DC

## 2013-10-01 SURGICAL SUPPLY — 27 items
BLADE HEX COATED 2.75 (ELECTRODE) ×2 IMPLANT
BLADE SURG 15 STRL LF DISP TIS (BLADE) ×1 IMPLANT
BLADE SURG 15 STRL SS (BLADE) ×2
BRIEF STRETCH FOR OB PAD LRG (UNDERPADS AND DIAPERS) ×2 IMPLANT
CANISTER SUCTION 2500CC (MISCELLANEOUS) ×2 IMPLANT
DECANTER SPIKE VIAL GLASS SM (MISCELLANEOUS) ×2 IMPLANT
DRSG PAD ABDOMINAL 8X10 ST (GAUZE/BANDAGES/DRESSINGS) ×1 IMPLANT
ELECT REM PT RETURN 9FT ADLT (ELECTROSURGICAL) ×2
ELECTRODE REM PT RTRN 9FT ADLT (ELECTROSURGICAL) ×1 IMPLANT
GAUZE SPONGE 4X4 12PLY STRL (GAUZE/BANDAGES/DRESSINGS) ×2 IMPLANT
GAUZE SPONGE 4X4 16PLY XRAY LF (GAUZE/BANDAGES/DRESSINGS) ×2 IMPLANT
GLOVE BIOGEL M 8.0 STRL (GLOVE) ×2 IMPLANT
GOWN SPEC L4 XLG W/TWL (GOWN DISPOSABLE) ×1 IMPLANT
GOWN STRL REUS W/TWL XL LVL3 (GOWN DISPOSABLE) ×5 IMPLANT
KIT BASIN OR (CUSTOM PROCEDURE TRAY) ×2 IMPLANT
LUBRICANT JELLY K Y 4OZ (MISCELLANEOUS) ×2 IMPLANT
NEEDLE HYPO 22GX1.5 SAFETY (NEEDLE) ×2 IMPLANT
PACK LITHOTOMY IV (CUSTOM PROCEDURE TRAY) ×2 IMPLANT
PAD ABD 8X10 STRL (GAUZE/BANDAGES/DRESSINGS) ×1 IMPLANT
PENCIL BUTTON HOLSTER BLD 10FT (ELECTRODE) ×2 IMPLANT
SHEARS HARMONIC 9CM CVD (BLADE) ×2 IMPLANT
SPONGE HEMORRHOID 8X3CM (HEMOSTASIS) IMPLANT
SUT CHROMIC 2 0 SH (SUTURE) IMPLANT
SUT CHROMIC 3 0 SH 27 (SUTURE) ×1 IMPLANT
SYRINGE 20CC LL (MISCELLANEOUS) ×2 IMPLANT
UNDERPAD 30X30 INCONTINENT (UNDERPADS AND DIAPERS) ×2 IMPLANT
YANKAUER SUCT BULB TIP 10FT TU (MISCELLANEOUS) ×2 IMPLANT

## 2013-10-01 NOTE — Transfer of Care (Signed)
Immediate Anesthesia Transfer of Care Note  Patient: Stacey Bean  Procedure(s) Performed: Procedure(s) (LRB): HEMORRHOIDECTOMY (N/A)  Patient Location: PACU  Anesthesia Type: General  Level of Consciousness: sedated, patient cooperative and responds to stimulation  Airway & Oxygen Therapy: Patient Spontanous Breathing and Patient connected to face mask oxgen  Post-op Assessment: Report given to PACU RN and Post -op Vital signs reviewed and stable  Post vital signs: Reviewed and stable  Complications: No apparent anesthesia complications

## 2013-10-01 NOTE — Op Note (Signed)
Surgeon: Kaylyn Lim, MD, FACS  Asst:  none  Anes:  general  Procedure: EUA and 3 column hemorrhoidectomy  Diagnosis: Hemorrhoids with prolapse  Complications: none  EBL:   minimal cc  Drains: non  Description of Procedure:  The patient was taken to OR 1 at [facility].  After anesthesia was administered and the patient was prepped a timeout was performed.  The patient was placed in the dorsal lithotomy position.  In this position she had remarkable prolapse of hemorrhoidal columns at 11,7 and mainly 4 oclock.  A rectal block using 20 cc of Exparel (that had been diluted to 30 cc) was first used to block the field.  Clamps were then applied to these columns beginning at 4, then 7 then 11.  These incorporated the internal hemorroidal mass and the external skin.  The largest mass was excised at 4 and the smallest at 11.  Each clamp was applied and the excess removed with the Harmonic scalpel.  These were oversewn with 3-0 chromic in a horizontal mattress fashion and then back to the apex with a running locking technique.  Hemostasis was present.  The last 10 cc of Exparel were injected in the perianal area.  Hemostatic sponge was placed in the anorectal canal.    The patient tolerated the procedure well and was taken to the PACU in stable condition.     Matt B. Hassell Done, [place], [facility], [place]

## 2013-10-01 NOTE — Discharge Instructions (Signed)
Hemorrhoidectomy Care After Hemorrhoidectomy is the removal of enlarged (dilated) veins around the rectum. Until the surgical areas are healed, control of pain and avoiding constipation are the greatest challenges for patients.  For as long as 24 hours after receiving an anesthetic (the medication that made you sleep), and while taking narcotic pain relievers, you may feel dizzy, weak and drowsy. For that reason, the following information applies to the first 24-hour period following surgery, and continues for as long as you are taking narcotic pain medications.  Do not drive a car, ride a bicycle, participate in activities in which you could be hurt. Do not take public transportation until you are off narcotic pain medications and until your caregiver says it is okay.  Do not drink alcohol, take tranquilizers, or medications not prescribed or allowed by your surgical caregiver.  Do not sign important papers or contracts for at least 24 hours or while taking narcotic medications.  Have a responsible person with you for 24 hours. RISKS AND COMPLICATIONS Some problems that may occur following this procedure include:  Infection. A germ starts growing in the tissue surrounding the site operated on. This can usually be treated with antibiotics.  Damage to the rectal sphincter could occur. This is the muscle that opens in your anus to allow a bowel movement. This could cause incontinence. This is uncommon.  Bleeding following surgery can be a complication of almost any surgery. Your surgeon takes every precaution to keep this from happening.  Complications of anesthesia. HOME CARE INSTRUCTIONS  Avoid straining when having bowel movements.  Avoid heavy lifting (more than 10 pounds (4.5 kilograms)).  Only take over-the-counter or prescription medicines for pain, discomfort, or fever as directed by your caregiver--Norco given for pain.  It can cause constipation  Take hot sitz baths for 20 to 30  minutes, 3 to 4 times per day.  To keep swelling down, apply an ice pack for twenty minutes three to four times per day between sitz baths. Use a towel between your skin and the ice pack. Do not do this if it causes too much discomfort.  Keep anal area clean and dry. Following a bowel movement, you can gently wash the area with tucks (available for purchase at a drugstore) or cotton swabs. Gently pat the area dry. Do not rub the area.  Eat a well balanced diet and drink 6 to 8 glasses of water every day to avoid constipation. A bulk laxative may be also be helpful. SEEK MEDICAL CARE IF:   You have increasing pain or tenderness near or in the surgical site.  You are unable to eat or drink.  You develop nausea or vomiting.  You develop uncontrolled bleeding such as soaking two to three pads in one hour.  You have constipation, not helped by changing your diet or increasing your fluid intake. Pain medications are a common cause of constipation.  You have pain and redness (inflammation) extending outside the area of your surgery.  You develop an unexplained oral temperature above 102 F (38.9 C), or any other signs of infection.  You have any other questions or concerns following surgery. Document Released: 04/29/2003 Document Revised: 05/01/2011 Document Reviewed: 07/27/2008 [facility] Patient Information 2015 [place], [place]. This information is not intended to replace advice given to you by your health care provider. Make sure you discuss any questions you have with your health care provider.  You have a surgical foam pad which you will pass after surgery-this may give you an  urge to have a BM

## 2013-10-01 NOTE — Interval H&P Note (Signed)
History and Physical Interval Note:  10/01/2013 8:50 AM  Stacey Bean  has presented today for surgery, with the diagnosis of hemorrhoids  The various methods of treatment have been discussed with the patient and family. After consideration of risks, benefits and other options for treatment, the patient has consented to  Procedure(s): HEMORRHOIDECTOMY (N/A) as a surgical intervention .  The patient's history has been reviewed, patient examined, no change in status, stable for surgery.  I have reviewed the patient's chart and labs.  Questions were answered to the patient's satisfaction.     Pablo Mathurin B

## 2013-10-01 NOTE — H&P (Signed)
Chief Complaint: Longstanding history of chronic recurrent hemorrhoidal pain, bleeding and pruritus  History of Present Illness: Stacey Bean is an 53 y.o. female referred by Dr. Phineas Real with chronic hemorrhoidal disease. This has been noted at the time of her colonoscopy by  et al. Following the examination we discussed hemorrhoidectomy in some detail including pain after surgery. She is aware of this would like to go ahead and have something done about her hemorrhoids. We'll go ahead and see to schedule her for exam under anesthesia with possible banding and/or internal hemorrhoidectomy and external hemorrhoidectomy for large skin tags.  Past Medical History   Diagnosis  Date   .  Hypertension    .  Hemorrhoid     Past Surgical History   Procedure  Laterality  Date   .  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  Nsvd       X 2   .  Btl   1991   .  Cholecystectomy   1994   .  Vaginal hysterectomy   2004     heavy bleeding, ovaries remain   .  Hydatid cyst   2004     Left at time of hysterectomy   .  Colonoscopy   11/2012     tubular adenoma, mod diverticulosis, int hem, rpt 5 yrs Deatra Ina)    Current Outpatient Prescriptions   Medication  Sig  Dispense  Refill   .  lisinopril-hydrochlorothiazide (PRINZIDE,ZESTORETIC) 10-12.5 MG per tablet  TAKE 1 TABLET BY MOUTH DAILY.  90 tablet  3    No current facility-administered medications for this visit.   Review of patient's allergies indicates no known allergies.  Family History   Problem  Relation  Age of Onset   .  Diabetes  Mother    .  Cancer  Mother      Melanoma and stomach cancer   .  Stroke  Mother  51     massive CVA   .  Stomach cancer  Mother    .  Hypertension  Father    .  Stroke  Father  8   .  Coronary artery disease  Neg Hx    .  Colon cancer  Neg Hx    .  Rectal cancer  Neg Hx    .  Esophageal cancer  Neg  Hx    .  Hypertension  Brother    Social History: reports that she quit smoking about 31 years ago. Her smoking use included Cigarettes. She smoked 0.00 packs per day. She has never used smokeless tobacco. She reports that she drinks about one ounce of alcohol per week. She reports that she does not use illicit drugs.  REVIEW OF SYSTEMS :  Positive for negative ; otherwise negative  Physical Exam:  Blood pressure 126/76, pulse 87, temperature 97.5 F (36.4 C), height 5\' 6"  (1.676 m), weight 165 lb (74.844 kg).  Body mass index is 26.64 kg/(m^2).  Gen: WDWN white female NAD  Neurological: Alert and oriented to person, place, and time. Motor and sensory function is grossly intact  Head: Normocephalic and atraumatic.  Eyes: Conjunctivae are normal. Pupils are equal, round, and reactive to light. No scleral icterus.  Neck: Normal range of motion. Neck supple. No tracheal deviation or thyromegaly present.  Cardiovascular: SR without murmurs or gallops. No carotid bruits  Breast: Not examined  Respiratory: Effort normal. No respiratory distress. No chest wall tenderness. Breath sounds normal. No wheezes, rales or rhonchi.  Abdomen: nontender  GU: Rectal exam showed external hemorrhoidal columns worse on the left than the right. Internal exam showed internal hemorrhoids that were discolored and purple. No fissure was noted. With Valsalva unsure she has prolapse of the internals contributing to her bleeding.  Musculoskeletal: Normal range of motion. Extremities are nontender. No cyanosis, edema or clubbing noted Lymphadenopathy: No cervical, preauricular, postauricular or axillary adenopathy is present Skin: Skin is warm and dry. No rash noted. No diaphoresis. No erythema. No pallor. Pscyh: Normal mood and affect. Behavior is normal. Judgment and thought content normal.  LABORATORY RESULTS:  No results found for this or any previous visit (from the past 48 hour(s)).  RADIOLOGY RESULTS:  No results  found.  Problem List:  Patient Active Problem List    Diagnosis  Date Noted   .  Internal hemorrhoids with other complication  82/95/6213   .  Benign neoplasm of colon  01/06/2013   .  Healthcare maintenance  09/02/2012   .  Perimenopausal  09/02/2012   .  Menometrorrhagia    .  Contact dermatitis  11/01/2010   .  Fever blister  11/01/2010   .  HYPERTENSION  01/09/2008   .  FIBROMYALGIA  07/31/2007   Assessment & Plan:  I discussed hemorrhoidectomy with her and she was wouldn't proceed with this. We'll consider banding when appropriate and internal hemorrhoidectomy as well as external hemorrhoidectomy. 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 Will proceed with outpatient hemorroidectomy.  Matt B. Hassell Done, MD, [facility]  [phone] beeper  [phone]

## 2013-10-01 NOTE — Anesthesia Postprocedure Evaluation (Signed)
  Anesthesia Post-op Note  Patient: Stacey Bean  Procedure(s) Performed: Procedure(s) (LRB): HEMORRHOIDECTOMY (N/A)  Patient Location: PACU  Anesthesia Type: General  Level of Consciousness: awake and alert   Airway and Oxygen Therapy: Patient Spontanous Breathing  Post-op Pain: mild  Post-op Assessment: Post-op Vital signs reviewed, Patient's Cardiovascular Status Stable, Respiratory Function Stable, Patent Airway and No signs of Nausea or vomiting  Last Vitals:  Filed Vitals:   10/01/13 1133  BP: 141/84  Pulse: 65  Temp:   Resp: 16    Post-op Vital Signs: stable   Complications: No apparent anesthesia complications

## 2013-10-01 NOTE — Anesthesia Preprocedure Evaluation (Addendum)
Anesthesia Evaluation  Patient identified by MRN, date of birth, ID band Patient awake    Reviewed: Allergy & Precautions, H&P , NPO status , Patient's Chart, lab work & pertinent test results  Airway Mallampati: II TM Distance: >3 FB Neck ROM: full    Dental no notable dental hx. (+) Teeth Intact, Dental Advisory Given   Pulmonary neg pulmonary ROS, former smoker,  breath sounds clear to auscultation  Pulmonary exam normal       Cardiovascular Exercise Tolerance: Good hypertension, Pt. on medications Rhythm:regular Rate:Normal     Neuro/Psych negative neurological ROS  negative psych ROS   GI/Hepatic negative GI ROS, Neg liver ROS,   Endo/Other  negative endocrine ROS  Renal/GU negative Renal ROS  negative genitourinary   Musculoskeletal   Abdominal   Peds  Hematology negative hematology ROS (+)   Anesthesia Other Findings   Reproductive/Obstetrics negative OB ROS                          Anesthesia Physical Anesthesia Plan  ASA: II  Anesthesia Plan: General   Post-op Pain Management:    Induction: Intravenous  Airway Management Planned: LMA  Additional Equipment:   Intra-op Plan:   Post-operative Plan:   Informed Consent: I have reviewed the patients History and Physical, chart, labs and discussed the procedure including the risks, benefits and alternatives for the proposed anesthesia with the patient or authorized representative who has indicated his/her understanding and acceptance.   Dental Advisory Given  Plan Discussed with: CRNA and Surgeon  Anesthesia Plan Comments:         Anesthesia Quick Evaluation

## 2013-10-02 ENCOUNTER — Encounter (HOSPITAL_COMMUNITY): Payer: Self-pay | Admitting: Surgery

## 2013-11-06 ENCOUNTER — Encounter (INDEPENDENT_AMBULATORY_CARE_PROVIDER_SITE_OTHER): Payer: 59 | Admitting: Surgery

## 2013-12-22 ENCOUNTER — Encounter (HOSPITAL_COMMUNITY): Payer: Self-pay | Admitting: Surgery

## 2014-06-11 ENCOUNTER — Other Ambulatory Visit: Payer: Self-pay

## 2014-06-11 DIAGNOSIS — Z1231 Encounter for screening mammogram for malignant neoplasm of breast: Secondary | ICD-10-CM

## 2014-06-17 ENCOUNTER — Ambulatory Visit
Admission: RE | Admit: 2014-06-17 | Discharge: 2014-06-17 | Disposition: A | Discharge status: Home / Self Care | Payer: 59 | Source: Ambulatory Visit

## 2014-06-17 DIAGNOSIS — Z1231 Encounter for screening mammogram for malignant neoplasm of breast: Secondary | ICD-10-CM

## 2014-08-06 ENCOUNTER — Other Ambulatory Visit: Payer: Self-pay | Admitting: *Deleted

## 2014-08-06 MED ORDER — LISINOPRIL-HYDROCHLOROTHIAZIDE 10-12.5 MG PO TABS: 1.0000 | ORAL_TABLET | Freq: Every morning | ORAL | Status: DC

## 2014-10-06 IMAGING — MG MM DIGITAL SCREENING BILAT W/ CAD
4 series · 4 of 4 positions shown · non-contrast
Comparison: Compared prior films

CLINICAL DATA: Screening.

DIGITAL BILATERAL SCREENING MAMMOGRAM WITH CAD

[R CC]
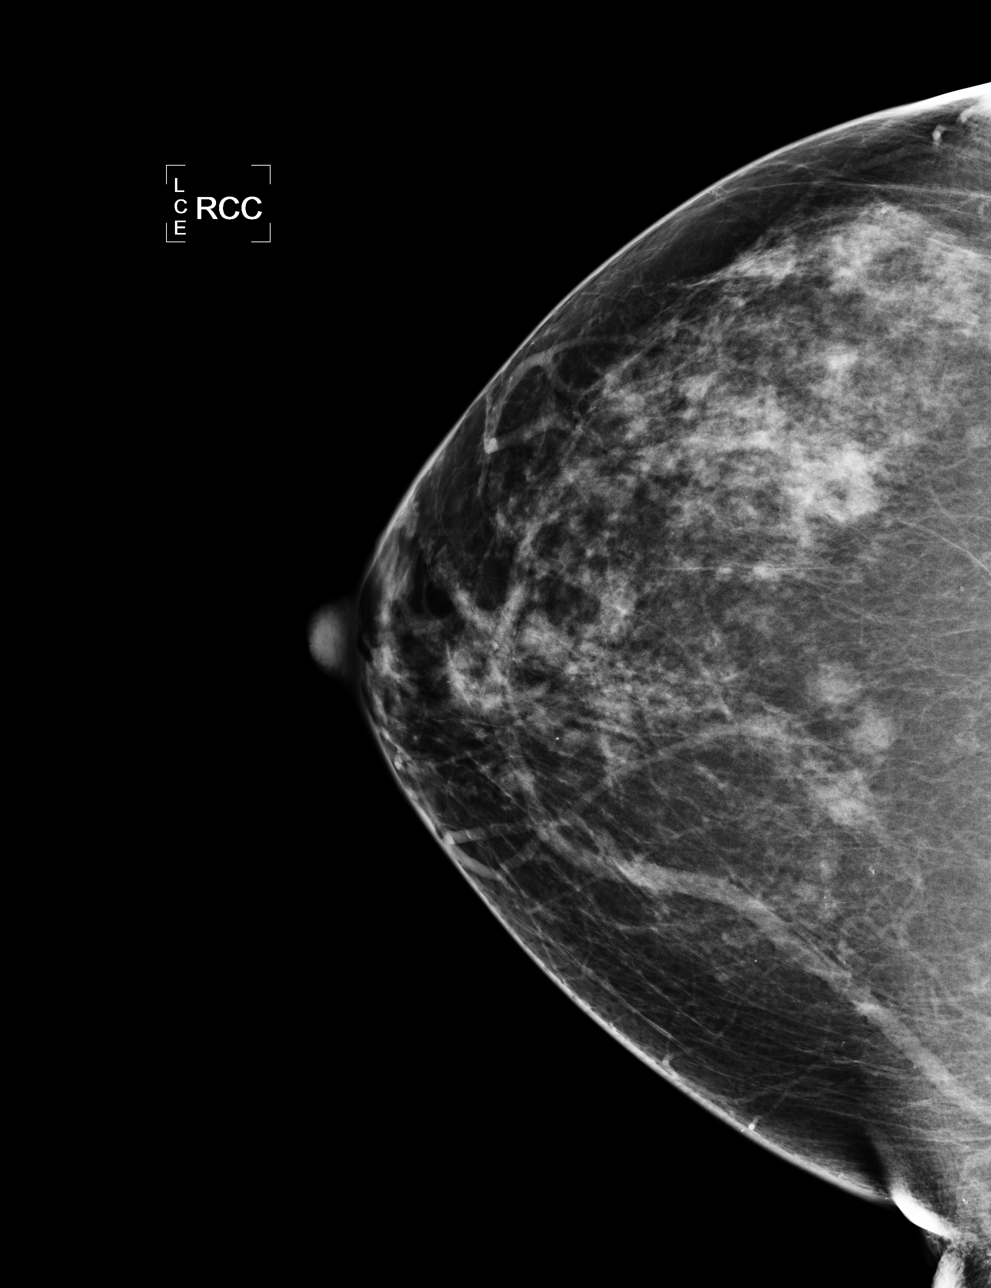

[L CC]
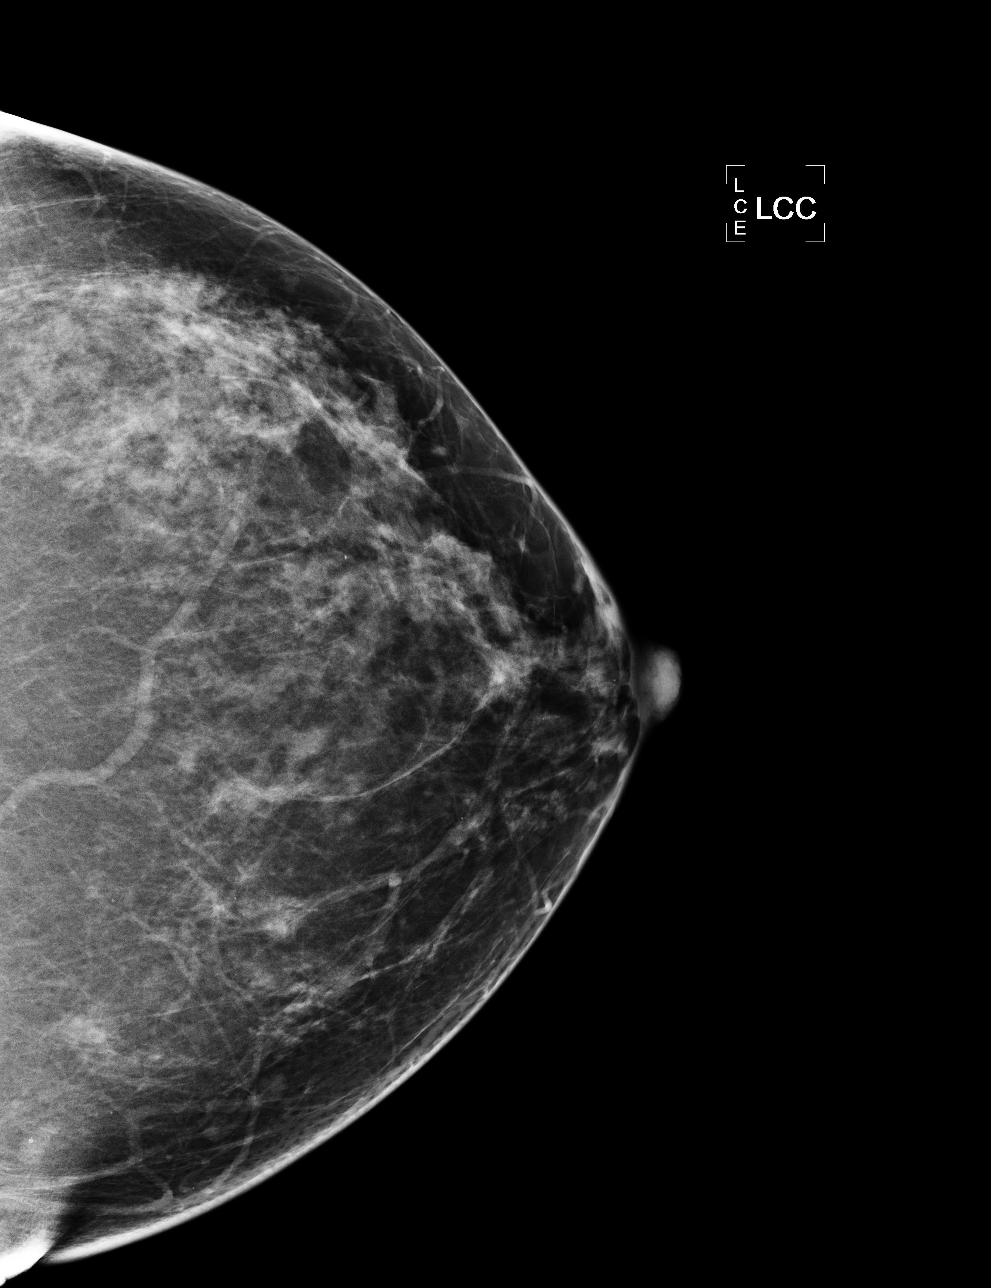

[L MLO]
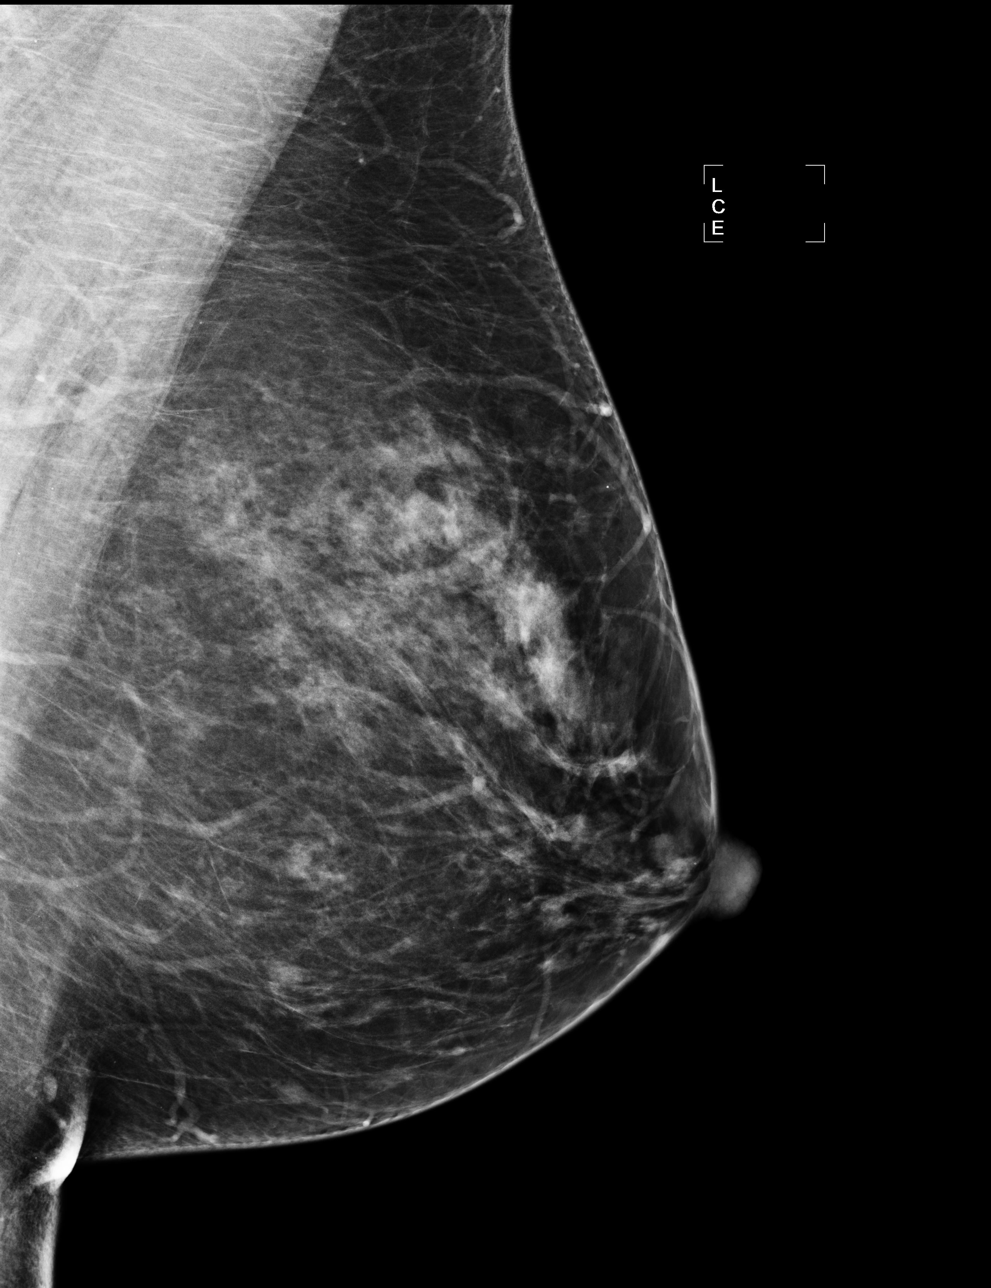

[R MLO]
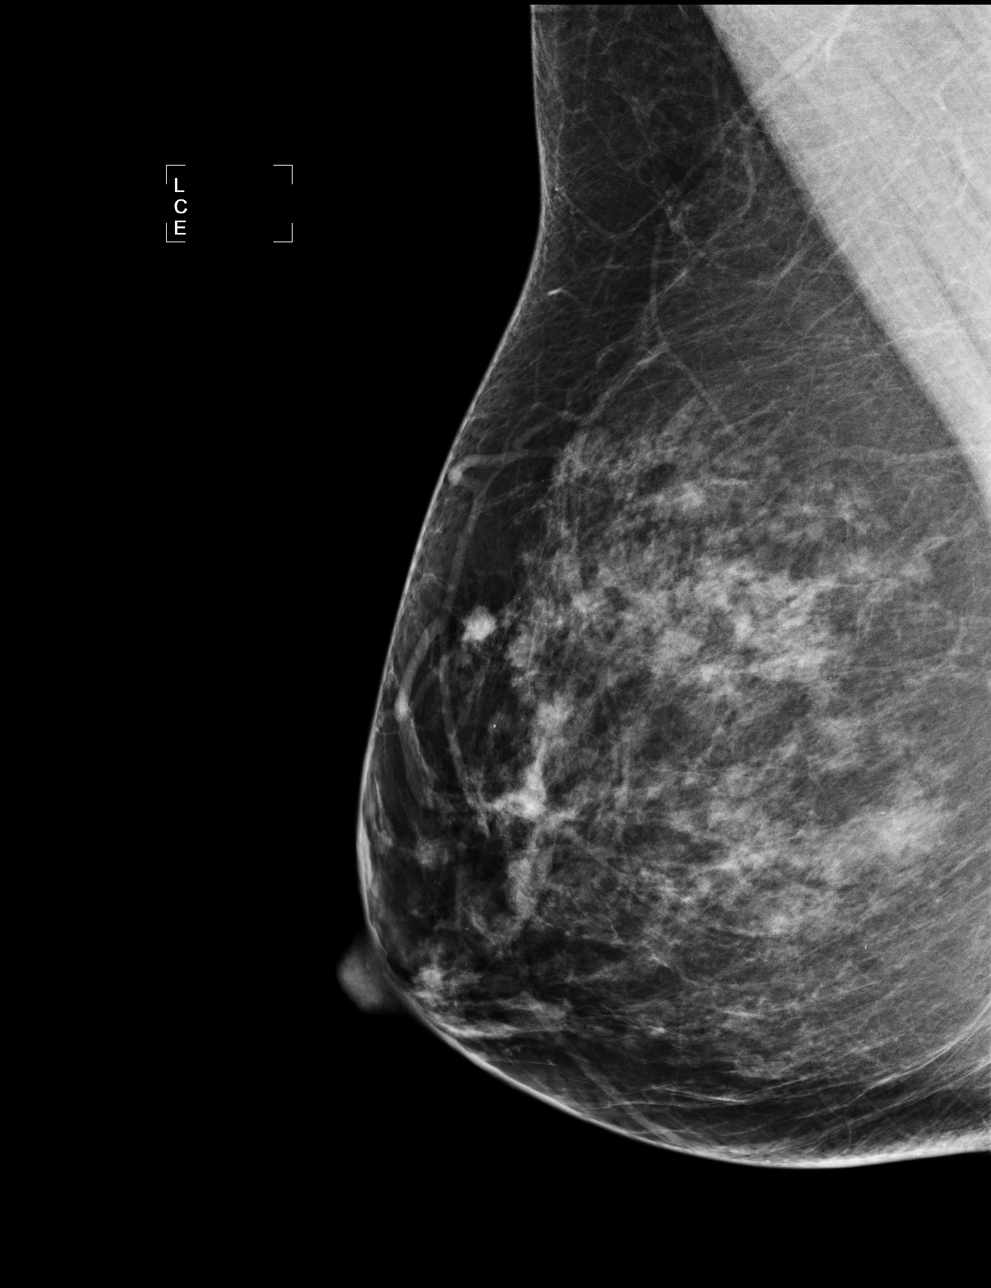

[4 of 4 positions shown; findings below may reference images not displayed]

FINDINGS: ACR Breast Density Category 3:The breast tissue is heterogeneously
dense.

In the right breast, a possible mass warrants further evaluation
with spot compression views and possibly ultrasound.  In the left
breast, no masses or malignant type calcifications are identified.

Images were processed with CAD.
IMPRESSION: Further evaluation is suggested for possible mass in the right
breast.

RECOMMENDATION:
Diagnostic mammogram and possibly ultrasound of the right breast.
(Code:[id number])

The patient will be contacted regarding the findings, and
additional imaging will be scheduled.

BI-RADS CATEGORY 0:  Incomplete.  Need additional imaging
evaluation and/or prior mammograms for comparison.

## 2014-10-17 ENCOUNTER — Other Ambulatory Visit: Payer: Self-pay | Admitting: Family Medicine

## 2014-10-17 DIAGNOSIS — Z0000 Encounter for general adult medical examination without abnormal findings: Secondary | ICD-10-CM

## 2014-10-17 DIAGNOSIS — I10 Essential (primary) hypertension: Secondary | ICD-10-CM

## 2014-10-20 ENCOUNTER — Other Ambulatory Visit (INDEPENDENT_AMBULATORY_CARE_PROVIDER_SITE_OTHER): Payer: 59

## 2014-10-20 DIAGNOSIS — I10 Essential (primary) hypertension: Secondary | ICD-10-CM | POA: Diagnosis not present

## 2014-10-20 DIAGNOSIS — Z0000 Encounter for general adult medical examination without abnormal findings: Secondary | ICD-10-CM | POA: Diagnosis not present

## 2014-10-20 LAB — CBC WITH DIFFERENTIAL/PLATELET
Basophils Absolute: 0 10*3/uL (ref 0.0–0.1)
Basophils Relative: 0.6 % (ref 0.0–3.0)
EOS PCT: 4.1 % (ref 0.0–5.0)
Eosinophils Absolute: 0.2 10*3/uL (ref 0.0–0.7)
HEMATOCRIT: 41.1 % (ref 36.0–46.0)
Hemoglobin: 13.6 g/dL (ref 12.0–15.0)
LYMPHS PCT: 27.3 % (ref 12.0–46.0)
Lymphs Abs: 1.2 10*3/uL (ref 0.7–4.0)
MCHC: 33.2 g/dL (ref 30.0–36.0)
MCV: 90 fl (ref 78.0–100.0)
MONOS PCT: 8.2 % (ref 3.0–12.0)
Monocytes Absolute: 0.4 10*3/uL (ref 0.1–1.0)
Neutro Abs: 2.6 10*3/uL (ref 1.4–7.7)
Neutrophils Relative %: 59.8 % (ref 43.0–77.0)
Platelets: 344 10*3/uL (ref 150.0–400.0)
RBC: 4.56 Mil/uL (ref 3.87–5.11)
RDW: 12.2 % (ref 11.5–15.5)
WBC: 4.4 10*3/uL (ref 4.0–10.5)

## 2014-10-20 LAB — BASIC METABOLIC PANEL
BUN: 14 mg/dL (ref 6–23)
CALCIUM: 9.3 mg/dL (ref 8.4–10.5)
CO2: 31 meq/L (ref 19–32)
Chloride: 105 mEq/L (ref 96–112)
Creatinine, Ser: 0.82 mg/dL (ref 0.40–1.20)
GFR: 77.78 mL/min (ref >60.00)
Glucose, Bld: 99 mg/dL (ref 70–99)
POTASSIUM: 4.2 meq/L (ref 3.5–5.1)
SODIUM: 141 meq/L (ref 135–145)

## 2014-10-20 LAB — MICROALBUMIN / CREATININE URINE RATIO
Creatinine,U: 444.6 mg/dL
MICROALB/CREAT RATIO: 0.4 mg/g (ref 0.0–30.0)
Microalb, Ur: 1.8 mg/dL (ref 0.0–1.9)

## 2014-10-27 ENCOUNTER — Encounter: Payer: Self-pay | Admitting: Family Medicine

## 2014-10-27 ENCOUNTER — Ambulatory Visit (INDEPENDENT_AMBULATORY_CARE_PROVIDER_SITE_OTHER): Payer: 59 | Admitting: Family Medicine

## 2014-10-27 VITALS — BP 128/66 | HR 80 | Temp 98.3°F | Ht 66.0 in | Wt 157.5 lb

## 2014-10-27 DIAGNOSIS — I10 Essential (primary) hypertension: Secondary | ICD-10-CM

## 2014-10-27 DIAGNOSIS — Z0000 Encounter for general adult medical examination without abnormal findings: Secondary | ICD-10-CM | POA: Diagnosis not present

## 2014-10-27 DIAGNOSIS — Z1159 Encounter for screening for other viral diseases: Secondary | ICD-10-CM

## 2014-10-27 MED ORDER — LISINOPRIL-HYDROCHLOROTHIAZIDE 10-12.5 MG PO TABS: 1.0000 | ORAL_TABLET | Freq: Every morning | ORAL | Status: DC

## 2014-10-27 NOTE — Assessment & Plan Note (Signed)
Chronic, stable. Continue current regimen. 

## 2014-10-27 NOTE — Progress Notes (Signed)
Pre visit review using our clinic review tool, if applicable. No additional management support is needed unless otherwise documented below in the visit note. 

## 2014-10-27 NOTE — Progress Notes (Signed)
BP 128/66 mmHg  Pulse 80  Temp(Src) 98.3 F (36.8 C) (Oral)  Ht 5\' 6"  (1.676 m)  Wt 157 lb 8 oz (71.442 kg)  BMI 25.43 kg/m2   CC: CPE  Subjective:    Patient ID: Stacey Bean, female    DOB: Jul 12, 1960, 54 y.o.   MRN: [id number]  HPI: Stacey Bean is a 54 y.o. female presenting on 10/27/2014 for Annual Exam   Preventative: COLONOSCOPY Date: 11/2012 tubular adenoma, mod diverticulosis, int hem, rpt 5 yrs Deatra Ina) Mammogram - 05/2014 WNL  Well woman - with OBGYN Dr. Phineas Real every few years VAGINAL HYSTERECTOMY Date: 2004 heavy bleeding, ovaries remain Flu shot - yearly at work. Td 2009  Sunscreen use discussed, no sunburns recently. Did have skin cancer screening.  Seat belt use discussed.   Caffeine: 2 cups coffee Widowed , husband died at age 62 of colon cancer.  Activity: treadmill a few days a week 40 min Diet: good water, daily fruits/vegetables   Relevant past medical, surgical, family and social history reviewed and updated as indicated. Interim medical history since our last visit reviewed. Allergies and medications reviewed and updated. No current outpatient prescriptions on file prior to visit.   No current facility-administered medications on file prior to visit.    Review of Systems  Constitutional: Negative for fever, chills, activity change, appetite change, fatigue and unexpected weight change.  HENT: Negative for hearing loss.   Eyes: Negative for visual disturbance.  Respiratory: Negative for cough, chest tightness, shortness of breath and wheezing.   Cardiovascular: Negative for chest pain, palpitations and leg swelling.  Gastrointestinal: Negative for nausea, vomiting, abdominal pain, diarrhea, constipation, blood in stool and abdominal distention.  Genitourinary: Negative for hematuria and difficulty urinating.  Musculoskeletal: Negative for myalgias, arthralgias and neck pain.  Skin: Negative for rash.  Neurological: Negative for dizziness,  seizures, syncope and headaches.  Hematological: Negative for adenopathy. Does not bruise/bleed easily.  Psychiatric/Behavioral: Negative for dysphoric mood. The patient is not nervous/anxious.    Per HPI unless specifically indicated above     Objective:    BP 128/66 mmHg  Pulse 80  Temp(Src) 98.3 F (36.8 C) (Oral)  Ht 5\' 6"  (1.676 m)  Wt 157 lb 8 oz (71.442 kg)  BMI 25.43 kg/m2  Wt Readings from Last 3 Encounters:  10/27/14 157 lb 8 oz (71.442 kg)  09/25/13 163 lb (73.936 kg)  09/04/13 163 lb 4 oz (74.05 kg)    Physical Exam  Constitutional: She is oriented to person, place, and time. She appears well-developed and well-nourished. No distress.  HENT:  Head: Normocephalic and atraumatic.  Right Ear: Hearing, tympanic membrane, external ear and ear canal normal.  Left Ear: Hearing, tympanic membrane, external ear and ear canal normal.  Nose: Nose normal.  Mouth/Throat: Uvula is midline, oropharynx is clear and moist and mucous membranes are normal. No oropharyngeal exudate, posterior oropharyngeal edema or posterior oropharyngeal erythema.  Eyes: Conjunctivae and EOM are normal. Pupils are equal, round, and reactive to light. No scleral icterus.  Neck: Normal range of motion. Neck supple. No thyromegaly present.  Cardiovascular: Normal rate, regular rhythm, normal heart sounds and intact distal pulses.   No murmur heard. Pulses:      Radial pulses are 2+ on the right side, and 2+ on the left side.  Pulmonary/Chest: Effort normal and breath sounds normal. No respiratory distress. She has no wheezes. She has no rales.  Abdominal: Soft. Bowel sounds are normal. She exhibits no distension and  no mass. There is no tenderness. There is no rebound and no guarding.  Musculoskeletal: Normal range of motion. She exhibits no edema.  Lymphadenopathy:    She has no cervical adenopathy.  Neurological: She is alert and oriented to person, place, and time.  CN grossly intact, station and  gait intact  Skin: Skin is warm and dry. No rash noted.  Psychiatric: She has a normal mood and affect. Her behavior is normal. Judgment and thought content normal.  Nursing note and vitals reviewed.  Results for orders placed or performed in visit on 10/20/14  Microalbumin / creatinine urine ratio  Result Value Ref Range   Microalb, Ur 1.8 0.0 - 1.9 mg/dL   Creatinine,U 444.6 mg/dL   Microalb Creat Ratio 0.4 0.0 - 30.0 mg/g  Basic metabolic panel  Result Value Ref Range   Sodium 141 135 - 145 mEq/L   Potassium 4.2 3.5 - 5.1 mEq/L   Chloride 105 96 - 112 mEq/L   CO2 31 19 - 32 mEq/L   Glucose, Bld 99 70 - 99 mg/dL   BUN 14 6 - 23 mg/dL   Creatinine, Ser 0.82 0.40 - 1.20 mg/dL   Calcium 9.3 8.4 - 10.5 mg/dL   GFR 77.78 >60.00 mL/min  CBC with Differential/Platelet  Result Value Ref Range   WBC 4.4 4.0 - 10.5 K/uL   RBC 4.56 3.87 - 5.11 Mil/uL   Hemoglobin 13.6 12.0 - 15.0 g/dL   HCT 41.1 36.0 - 46.0 %   MCV 90.0 78.0 - 100.0 fl   MCHC 33.2 30.0 - 36.0 g/dL   RDW 12.2 11.5 - 15.5 %   Platelets 344.0 150.0 - 400.0 K/uL   Neutrophils Relative % 59.8 43.0 - 77.0 %   Lymphocytes Relative 27.3 12.0 - 46.0 %   Monocytes Relative 8.2 3.0 - 12.0 %   Eosinophils Relative 4.1 0.0 - 5.0 %   Basophils Relative 0.6 0.0 - 3.0 %   Neutro Abs 2.6 1.4 - 7.7 K/uL   Lymphs Abs 1.2 0.7 - 4.0 K/uL   Monocytes Absolute 0.4 0.1 - 1.0 K/uL   Eosinophils Absolute 0.2 0.0 - 0.7 K/uL   Basophils Absolute 0.0 0.0 - 0.1 K/uL      Assessment & Plan:   Problem List Items Addressed This Visit    Essential hypertension    Chronic, stable. Continue current regimen.      Relevant Medications   lisinopril-hydrochlorothiazide (PRINZIDE,ZESTORETIC) 10-12.5 MG per tablet   Other Relevant Orders   Comprehensive metabolic panel   Microalbumin / creatinine urine ratio   Healthcare maintenance - Primary    Preventative protocols reviewed and updated unless pt declined. Discussed healthy diet and  lifestyle.        Other Visit Diagnoses    Need for hepatitis C screening test        Relevant Orders    Hepatitis C antibody, reflex        Follow up plan: Return in about 1 year (around 10/27/2015), or as needed, for annual exam, prior fasting for blood work.

## 2014-10-27 NOTE — Patient Instructions (Signed)
You are doing well today Return as needed or in 1 year for next physical Get flu shot at work  Health Maintenance Adopting a healthy lifestyle and getting preventive care can go a long way to promote health and wellness. Talk with your health care provider about what schedule of regular examinations is right for you. This is a good chance for you to check in with your provider about disease prevention and staying healthy. In between checkups, there are plenty of things you can do on your own. Experts have done a lot of research about which lifestyle changes and preventive measures are most likely to keep you healthy. Ask your health care provider for more information. WEIGHT AND DIET  Eat a healthy diet  Be sure to include plenty of vegetables, fruits, low-fat dairy products, and lean protein.  Do not eat a lot of foods high in solid fats, added sugars, or salt.  Get regular exercise. This is one of the most important things you can do for your health.  Most adults should exercise for at least 150 minutes each week. The exercise should increase your heart rate and make you sweat (moderate-intensity exercise).  Most adults should also do strengthening exercises at least twice a week. This is in addition to the moderate-intensity exercise.  Maintain a healthy weight  Body mass index (BMI) is a measurement that can be used to identify possible weight problems. It estimates body fat based on height and weight. Your health care provider can help determine your BMI and help you achieve or maintain a healthy weight.  For females 58 years of age and older:   A BMI below 18.5 is considered underweight.  A BMI of 18.5 to 24.9 is normal.  A BMI of 25 to 29.9 is considered overweight.  A BMI of 30 and above is considered obese.  Watch levels of cholesterol and blood lipids  You should start having your blood tested for lipids and cholesterol at 52 years of age, then have this test every 5  years.  You may need to have your cholesterol levels checked more often if:  Your lipid or cholesterol levels are high.  You are older than 52 years of age.  You are at high risk for heart disease.  CANCER SCREENING   Lung Cancer  Lung cancer screening is recommended for adults 21-64 years old who are at high risk for lung cancer because of a history of smoking.  A yearly low-dose CT scan of the lungs is recommended for people who:  Currently smoke.  Have quit within the past 15 years.  Have at least a 30-pack-year history of smoking. A pack year is smoking an average of one pack of cigarettes a day for 1 year.  Yearly screening should continue until it has been 15 years since you quit.  Yearly screening should stop if you develop a health problem that would prevent you from having lung cancer treatment.  Breast Cancer  Practice breast self-awareness. This means understanding how your breasts normally appear and feel.  It also means doing regular breast self-exams. Let your health care provider know about any changes, no matter how small.  If you are in your 20s or 30s, you should have a clinical breast exam (CBE) by a health care provider every 1-3 years as part of a regular health exam.  If you are 76 or older, have a CBE every year. Also consider having a breast X-ray (mammogram) every year.  If  you have a family history of breast cancer, talk to your health care provider about genetic screening.  If you are at high risk for breast cancer, talk to your health care provider about having an MRI and a mammogram every year.  Breast cancer gene (BRCA) assessment is recommended for women who have family members with BRCA-related cancers. BRCA-related cancers include:  Breast.  Ovarian.  Tubal.  Peritoneal cancers.  Results of the assessment will determine the need for genetic counseling and BRCA1 and BRCA2 testing. Cervical Cancer Routine pelvic examinations to  screen for cervical cancer are no longer recommended for nonpregnant women who are considered low risk for cancer of the pelvic organs (ovaries, uterus, and vagina) and who do not have symptoms. A pelvic examination may be necessary if you have symptoms including those associated with pelvic infections. Ask your health care provider if a screening pelvic exam is right for you.   The Pap test is the screening test for cervical cancer for women who are considered at risk.  If you had a hysterectomy for a problem that was not cancer or a condition that could lead to cancer, then you no longer need Pap tests.  If you are older than 65 years, and you have had normal Pap tests for the past 10 years, you no longer need to have Pap tests.  If you have had past treatment for cervical cancer or a condition that could lead to cancer, you need Pap tests and screening for cancer for at least 20 years after your treatment.  If you no longer get a Pap test, assess your risk factors if they change (such as having a new sexual partner). This can affect whether you should start being screened again.  Some women have medical problems that increase their chance of getting cervical cancer. If this is the case for you, your health care provider may recommend more frequent screening and Pap tests.  The human papillomavirus (HPV) test is another test that may be used for cervical cancer screening. The HPV test looks for the virus that can cause cell changes in the cervix. The cells collected during the Pap test can be tested for HPV.  The HPV test can be used to screen women 10 years of age and older. Getting tested for HPV can extend the interval between normal Pap tests from three to five years.  An HPV test also should be used to screen women of any age who have unclear Pap test results.  After 52 years of age, women should have HPV testing as often as Pap tests.  Colorectal Cancer  This type of cancer can be  detected and often prevented.  Routine colorectal cancer screening usually begins at 52 years of age and continues through 52 years of age.  Your health care provider may recommend screening at an earlier age if you have risk factors for colon cancer.  Your health care provider may also recommend using home test kits to check for hidden blood in the stool.  A small camera at the end of a tube can be used to examine your colon directly (sigmoidoscopy or colonoscopy). This is done to check for the earliest forms of colorectal cancer.  Routine screening usually begins at age 83.  Direct examination of the colon should be repeated every 5-10 years through 52 years of age. However, you may need to be screened more often if early forms of precancerous polyps or small growths are found. Skin Cancer  Check your skin from head to toe regularly.  Tell your health care provider about any new moles or changes in moles, especially if there is a change in a mole's shape or color.  Also tell your health care provider if you have a mole that is larger than the size of a pencil eraser.  Always use sunscreen. Apply sunscreen liberally and repeatedly throughout the day.  Protect yourself by wearing long sleeves, pants, a wide-brimmed hat, and sunglasses whenever you are outside. HEART DISEASE, DIABETES, AND HIGH BLOOD PRESSURE   Have your blood pressure checked at least every 1-2 years. High blood pressure causes heart disease and increases the risk of stroke.  If you are between 51 years and 36 years old, ask your health care provider if you should take aspirin to prevent strokes.  Have regular diabetes screenings. This involves taking a blood sample to check your fasting blood sugar level.  If you are at a normal weight and have a low risk for diabetes, have this test once every three years after 52 years of age.  If you are overweight and have a high risk for diabetes, consider being tested at a  younger age or more often. PREVENTING INFECTION  Hepatitis B  If you have a higher risk for hepatitis B, you should be screened for this virus. You are considered at high risk for hepatitis B if:  You were born in a country where hepatitis B is common. Ask your health care provider which countries are considered high risk.  Your parents were born in a high-risk country, and you have not been immunized against hepatitis B (hepatitis B vaccine).  You have HIV or AIDS.  You use needles to inject street drugs.  You live with someone who has hepatitis B.  You have had sex with someone who has hepatitis B.  You get hemodialysis treatment.  You take certain medicines for conditions, including cancer, organ transplantation, and autoimmune conditions. Hepatitis C  Blood testing is recommended for:  Everyone born from [birth year] through 1963.  Anyone with known risk factors for hepatitis C. Sexually transmitted infections (STIs)  You should be screened for sexually transmitted infections (STIs) including gonorrhea and chlamydia if:  You are sexually active and are younger than 52 years of age.  You are older than 52 years of age and your health care provider tells you that you are at risk for this type of infection.  Your sexual activity has changed since you were last screened and you are at an increased risk for chlamydia or gonorrhea. Ask your health care provider if you are at risk.  If you do not have HIV, but are at risk, it may be recommended that you take a prescription medicine daily to prevent HIV infection. This is called pre-exposure prophylaxis (PrEP). You are considered at risk if:  You are sexually active and do not regularly use condoms or know the HIV status of your partner(s).  You take drugs by injection.  You are sexually active with a partner who has HIV. Talk with your health care provider about whether you are at high risk of being infected with HIV. If you choose  to begin PrEP, you should first be tested for HIV. You should then be tested every 3 months for as long as you are taking PrEP.  PREGNANCY   If you are premenopausal and you may become pregnant, ask your health care provider about preconception counseling.  If you may become pregnant,  take 400 to 800 micrograms (mcg) of folic acid every day.  If you want to prevent pregnancy, talk to your health care provider about birth control (contraception). OSTEOPOROSIS AND MENOPAUSE   Osteoporosis is a disease in which the bones lose minerals and strength with aging. This can result in serious bone fractures. Your risk for osteoporosis can be identified using a bone density scan.  If you are 66 years of age or older, or if you are at risk for osteoporosis and fractures, ask your health care provider if you should be screened.  Ask your health care provider whether you should take a calcium or vitamin D supplement to lower your risk for osteoporosis.  Menopause may have certain physical symptoms and risks.  Hormone replacement therapy may reduce some of these symptoms and risks. Talk to your health care provider about whether hormone replacement therapy is right for you.  HOME CARE INSTRUCTIONS   Schedule regular health, dental, and eye exams.  Stay current with your immunizations.   Do not use any tobacco products including cigarettes, chewing tobacco, or electronic cigarettes.  If you are pregnant, do not drink alcohol.  If you are breastfeeding, limit how much and how often you drink alcohol.  Limit alcohol intake to no more than 1 drink per day for nonpregnant women. One drink equals 12 ounces of beer, 5 ounces of wine, or 1 ounces of hard liquor.  Do not use street drugs.  Do not share needles.  Ask your health care provider for help if you need support or information about quitting drugs.  Tell your health care provider if you often feel depressed.  Tell your health care  provider if you have ever been abused or do not feel safe at home. Document Released: 08/22/2010 Document Revised: 06/23/2013 Document Reviewed: 01/08/2013 [facility] Patient Information 2015 [place], [place]. This information is not intended to replace advice given to you by your health care provider. Make sure you discuss any questions you have with your health care provider.

## 2014-10-27 NOTE — Assessment & Plan Note (Signed)
Preventative protocols reviewed and updated unless pt declined. Discussed healthy diet and lifestyle.  

## 2015-06-03 ENCOUNTER — Other Ambulatory Visit: Payer: Self-pay

## 2015-06-03 DIAGNOSIS — Z1231 Encounter for screening mammogram for malignant neoplasm of breast: Secondary | ICD-10-CM

## 2015-06-22 ENCOUNTER — Ambulatory Visit
Admission: RE | Admit: 2015-06-22 | Discharge: 2015-06-22 | Disposition: A | Discharge status: Home / Self Care | Payer: 59 | Source: Ambulatory Visit

## 2015-06-22 DIAGNOSIS — Z1231 Encounter for screening mammogram for malignant neoplasm of breast: Secondary | ICD-10-CM

## 2015-07-28 ENCOUNTER — Ambulatory Visit (INDEPENDENT_AMBULATORY_CARE_PROVIDER_SITE_OTHER): Payer: 59 | Admitting: Gynecology

## 2015-07-28 ENCOUNTER — Encounter: Payer: Self-pay | Admitting: Gynecology

## 2015-07-28 VITALS — BP 118/74

## 2015-07-28 DIAGNOSIS — N898 Other specified noninflammatory disorders of vagina: Secondary | ICD-10-CM

## 2015-07-28 DIAGNOSIS — N907 Vulvar cyst: Secondary | ICD-10-CM

## 2015-07-28 LAB — WET PREP FOR TRICH, YEAST, CLUE
CLUE CELLS WET PREP: NONE SEEN
Trich, Wet Prep: NONE SEEN

## 2015-07-28 MED ORDER — FLUCONAZOLE 150 MG PO TABS: 150.0000 mg | ORAL_TABLET | Freq: Once | ORAL | Status: DC

## 2015-07-28 NOTE — Patient Instructions (Addendum)
Take 1 Diflucan. Call if the vaginal irritation continues.  Use the second pill if the irritation returns.

## 2015-07-28 NOTE — Progress Notes (Signed)
    Stacey Bean [birth year] [id number]        55 y.o.  G2P2002 presents with several day history of vaginal irritation. No discharge.  Some itching. No odor.No urinary frequency, dysuria or urgency.  No fever or chills. No nausea vomiting diarrhea constipation.  Past medical history,surgical history, problem list, medications, allergies, family history and social history were all reviewed and documented in the EPIC chart.  Directed ROS with pertinent positives and negatives documented in the history of present illness/assessment and plan.  Exam: Caryn Bee assistant Filed Vitals:   07/28/15 0922  BP: 118/74   General appearance:  Normal Abdomen soft nontender without masse guarding rebound Pelvic external BUS vagina with white discharge.  Small sebaceous upper right labia majora.  Bimanual without masses or tenderness  Assessment/Plan:  55 y.o. [id number] with history, exam and wet prep consistent with yeast vaginitis. Diflucan 150 mg 1 dose. Call if symptoms persist. Refill 1 provided in case she has recurrence. Small sebaceous cyst upper right labia majora discussed. Will monitor and report any changes.    Anastasio Auerbach MD, 9:31 AM 07/28/2015

## 2015-09-17 ENCOUNTER — Encounter: Payer: Self-pay | Admitting: Gynecology

## 2015-10-28 ENCOUNTER — Ambulatory Visit (INDEPENDENT_AMBULATORY_CARE_PROVIDER_SITE_OTHER): Payer: 59 | Admitting: Family Medicine

## 2015-10-28 ENCOUNTER — Encounter: Payer: Self-pay | Admitting: Family Medicine

## 2015-10-28 VITALS — BP 132/88 | HR 70 | Temp 98.0°F | Ht 66.0 in | Wt 160.0 lb

## 2015-10-28 DIAGNOSIS — Z0000 Encounter for general adult medical examination without abnormal findings: Secondary | ICD-10-CM

## 2015-10-28 DIAGNOSIS — I10 Essential (primary) hypertension: Secondary | ICD-10-CM | POA: Diagnosis not present

## 2015-10-28 LAB — COMPREHENSIVE METABOLIC PANEL
ALT: 9 U/L (ref 0–35)
AST: 15 U/L (ref 0–37)
Albumin: 4.1 g/dL (ref 3.5–5.2)
Alkaline Phosphatase: 45 U/L (ref 39–117)
BILIRUBIN TOTAL: 0.5 mg/dL (ref 0.2–1.2)
BUN: 13 mg/dL (ref 6–23)
CALCIUM: 8.9 mg/dL (ref 8.4–10.5)
CHLORIDE: 104 meq/L (ref 96–112)
CO2: 30 meq/L (ref 19–32)
CREATININE: 0.76 mg/dL (ref 0.40–1.20)
GFR: 84.57 mL/min (ref >60.00)
GLUCOSE: 87 mg/dL (ref 70–99)
Potassium: 4.1 mEq/L (ref 3.5–5.1)
Sodium: 139 mEq/L (ref 135–145)
Total Protein: 7.3 g/dL (ref 6.0–8.3)

## 2015-10-28 LAB — TSH: TSH: 1.06 u[IU]/mL (ref 0.35–4.50)

## 2015-10-28 NOTE — Assessment & Plan Note (Signed)
Chronic, stable. Continue current regimen. 

## 2015-10-28 NOTE — Assessment & Plan Note (Signed)
Preventative protocols reviewed and updated unless pt declined. Discussed healthy diet and lifestyle.  

## 2015-10-28 NOTE — Patient Instructions (Addendum)
labwork today. You are doing well today. Return as needed or in 1 year for next physical.  Health Maintenance, Female Adopting a healthy lifestyle and getting preventive care can go a long way to promote health and wellness. Talk with your health care provider about what schedule of regular examinations is right for you. This is a good chance for you to check in with your provider about disease prevention and staying healthy. In between checkups, there are plenty of things you can do on your own. Experts have done a lot of research about which lifestyle changes and preventive measures are most likely to keep you healthy. Ask your health care provider for more information. WEIGHT AND DIET  Eat a healthy diet  Be sure to include plenty of vegetables, fruits, low-fat dairy products, and lean protein.  Do not eat a lot of foods high in solid fats, added sugars, or salt.  Get regular exercise. This is one of the most important things you can do for your health.  Most adults should exercise for at least 150 minutes each week. The exercise should increase your heart rate and make you sweat (moderate-intensity exercise).  Most adults should also do strengthening exercises at least twice a week. This is in addition to the moderate-intensity exercise.  Maintain a healthy weight  Body mass index (BMI) is a measurement that can be used to identify possible weight problems. It estimates body fat based on height and weight. Your health care provider can help determine your BMI and help you achieve or maintain a healthy weight.  For females 47 years of age and older:   A BMI below 18.5 is considered underweight.  A BMI of 18.5 to 24.9 is normal.  A BMI of 25 to 29.9 is considered overweight.  A BMI of 30 and above is considered obese.  Watch levels of cholesterol and blood lipids  You should start having your blood tested for lipids and cholesterol at 53 years of age, then have this test every  5 years.  You may need to have your cholesterol levels checked more often if:  Your lipid or cholesterol levels are high.  You are older than 53 years of age.  You are at high risk for heart disease.  CANCER SCREENING   Lung Cancer  Lung cancer screening is recommended for adults 64-44 years old who are at high risk for lung cancer because of a history of smoking.  A yearly low-dose CT scan of the lungs is recommended for people who:  Currently smoke.  Have quit within the past 15 years.  Have at least a 30-pack-year history of smoking. A pack year is smoking an average of one pack of cigarettes a day for 1 year.  Yearly screening should continue until it has been 15 years since you quit.  Yearly screening should stop if you develop a health problem that would prevent you from having lung cancer treatment.  Breast Cancer  Practice breast self-awareness. This means understanding how your breasts normally appear and feel.  It also means doing regular breast self-exams. Let your health care provider know about any changes, no matter how small.  If you are in your 20s or 30s, you should have a clinical breast exam (CBE) by a health care provider every 1-3 years as part of a regular health exam.  If you are 81 or older, have a CBE every year. Also consider having a breast X-ray (mammogram) every year.  If you have  a family history of breast cancer, talk to your health care provider about genetic screening.  If you are at high risk for breast cancer, talk to your health care provider about having an MRI and a mammogram every year.  Breast cancer gene (BRCA) assessment is recommended for women who have family members with BRCA-related cancers. BRCA-related cancers include:  Breast.  Ovarian.  Tubal.  Peritoneal cancers.  Results of the assessment will determine the need for genetic counseling and BRCA1 and BRCA2 testing. Cervical Cancer Your health care provider may  recommend that you be screened regularly for cancer of the pelvic organs (ovaries, uterus, and vagina). This screening involves a pelvic examination, including checking for microscopic changes to the surface of your cervix (Pap test). You may be encouraged to have this screening done every 3 years, beginning at age 21.  For women ages 30-65, health care providers may recommend pelvic exams and Pap testing every 3 years, or they may recommend the Pap and pelvic exam, combined with testing for human papilloma virus (HPV), every 5 years. Some types of HPV increase your risk of cervical cancer. Testing for HPV may also be done on women of any age with unclear Pap test results.  Other health care providers may not recommend any screening for nonpregnant women who are considered low risk for pelvic cancer and who do not have symptoms. Ask your health care provider if a screening pelvic exam is right for you.  If you have had past treatment for cervical cancer or a condition that could lead to cancer, you need Pap tests and screening for cancer for at least 20 years after your treatment. If Pap tests have been discontinued, your risk factors (such as having a new sexual partner) need to be reassessed to determine if screening should resume. Some women have medical problems that increase the chance of getting cervical cancer. In these cases, your health care provider may recommend more frequent screening and Pap tests. Colorectal Cancer  This type of cancer can be detected and often prevented.  Routine colorectal cancer screening usually begins at 53 years of age and continues through 53 years of age.  Your health care provider may recommend screening at an earlier age if you have risk factors for colon cancer.  Your health care provider may also recommend using home test kits to check for hidden blood in the stool.  A small camera at the end of a tube can be used to examine your colon directly  (sigmoidoscopy or colonoscopy). This is done to check for the earliest forms of colorectal cancer.  Routine screening usually begins at age 50.  Direct examination of the colon should be repeated every 5-10 years through 53 years of age. However, you may need to be screened more often if early forms of precancerous polyps or small growths are found. Skin Cancer  Check your skin from head to toe regularly.  Tell your health care provider about any new moles or changes in moles, especially if there is a change in a mole's shape or color.  Also tell your health care provider if you have a mole that is larger than the size of a pencil eraser.  Always use sunscreen. Apply sunscreen liberally and repeatedly throughout the day.  Protect yourself by wearing long sleeves, pants, a wide-brimmed hat, and sunglasses whenever you are outside. HEART DISEASE, DIABETES, AND HIGH BLOOD PRESSURE   High blood pressure causes heart disease and increases the risk of stroke. High   blood pressure is more likely to develop in:  People who have blood pressure in the high end of the normal range (130-139/85-89 mm Hg).  People who are overweight or obese.  People who are African American.  If you are 18-39 years of age, have your blood pressure checked every 3-5 years. If you are 40 years of age or older, have your blood pressure checked every year. You should have your blood pressure measured twice--once when you are at a hospital or clinic, and once when you are not at a hospital or clinic. Record the average of the two measurements. To check your blood pressure when you are not at a hospital or clinic, you can use:  An automated blood pressure machine at a pharmacy.  A home blood pressure monitor.  If you are between 55 years and 79 years old, ask your health care provider if you should take aspirin to prevent strokes.  Have regular diabetes screenings. This involves taking a blood sample to check your  fasting blood sugar level.  If you are at a normal weight and have a low risk for diabetes, have this test once every three years after 53 years of age.  If you are overweight and have a high risk for diabetes, consider being tested at a younger age or more often. PREVENTING INFECTION  Hepatitis B  If you have a higher risk for hepatitis B, you should be screened for this virus. You are considered at high risk for hepatitis B if:  You were born in a country where hepatitis B is common. Ask your health care provider which countries are considered high risk.  Your parents were born in a high-risk country, and you have not been immunized against hepatitis B (hepatitis B vaccine).  You have HIV or AIDS.  You use needles to inject street drugs.  You live with someone who has hepatitis B.  You have had sex with someone who has hepatitis B.  You get hemodialysis treatment.  You take certain medicines for conditions, including cancer, organ transplantation, and autoimmune conditions. Hepatitis C  Blood testing is recommended for:  Everyone born from 1945 through 1965.  Anyone with known risk factors for hepatitis C. Sexually transmitted infections (STIs)  You should be screened for sexually transmitted infections (STIs) including gonorrhea and chlamydia if:  You are sexually active and are younger than 53 years of age.  You are older than 53 years of age and your health care provider tells you that you are at risk for this type of infection.  Your sexual activity has changed since you were last screened and you are at an increased risk for chlamydia or gonorrhea. Ask your health care provider if you are at risk.  If you do not have HIV, but are at risk, it may be recommended that you take a prescription medicine daily to prevent HIV infection. This is called pre-exposure prophylaxis (PrEP). You are considered at risk if:  You are sexually active and do not regularly use condoms or  know the HIV status of your partner(s).  You take drugs by injection.  You are sexually active with a partner who has HIV. Talk with your health care provider about whether you are at high risk of being infected with HIV. If you choose to begin PrEP, you should first be tested for HIV. You should then be tested every 3 months for as long as you are taking PrEP.  PREGNANCY   If you are   premenopausal and you may become pregnant, ask your health care provider about preconception counseling.  If you may become pregnant, take 400 to 800 micrograms (mcg) of folic acid every day.  If you want to prevent pregnancy, talk to your health care provider about birth control (contraception). OSTEOPOROSIS AND MENOPAUSE   Osteoporosis is a disease in which the bones lose minerals and strength with aging. This can result in serious bone fractures. Your risk for osteoporosis can be identified using a bone density scan.  If you are 69 years of age or older, or if you are at risk for osteoporosis and fractures, ask your health care provider if you should be screened.  Ask your health care provider whether you should take a calcium or vitamin D supplement to lower your risk for osteoporosis.  Menopause may have certain physical symptoms and risks.  Hormone replacement therapy may reduce some of these symptoms and risks. Talk to your health care provider about whether hormone replacement therapy is right for you.  HOME CARE INSTRUCTIONS   Schedule regular health, dental, and eye exams.  Stay current with your immunizations.   Do not use any tobacco products including cigarettes, chewing tobacco, or electronic cigarettes.  If you are pregnant, do not drink alcohol.  If you are breastfeeding, limit how much and how often you drink alcohol.  Limit alcohol intake to no more than 1 drink per day for nonpregnant women. One drink equals 12 ounces of beer, 5 ounces of wine, or 1 ounces of hard liquor.  Do  not use street drugs.  Do not share needles.  Ask your health care provider for help if you need support or information about quitting drugs.  Tell your health care provider if you often feel depressed.  Tell your health care provider if you have ever been abused or do not feel safe at home.   This information is not intended to replace advice given to you by your health care provider. Make sure you discuss any questions you have with your health care provider.   Document Released: 08/22/2010 Document Revised: 02/27/2014 Document Reviewed: 01/08/2013 Elsevier Interactive Patient Education [employer].

## 2015-10-28 NOTE — Progress Notes (Signed)
Pre visit review using our clinic review tool, if applicable. No additional management support is needed unless otherwise documented below in the visit note. 

## 2015-10-28 NOTE — Progress Notes (Signed)
BP 132/88 (BP Location: Right Arm, Cuff Size: Normal)   Pulse 70   Temp 98 F (36.7 C) (Oral)   Ht 5\' 6"  (1.676 m)   Wt 160 lb (72.6 kg)   BMI 25.82 kg/m    CC: CPE Subjective:    Patient ID: Stacey Bean, female    DOB: [birth year], 55 y.o.   MRN: [id number]  HPI: Stacey Bean is a 55 y.o. female presenting on 10/28/2015 for Annual Exam   Stressful day - has job interview later today.  Recent vacation up [place] [facility]) with daughter, saw family.  Preventative: COLONOSCOPY Date: 11/2012 tubular adenoma, mod diverticulosis, int hem, rpt 5 yrs Deatra Ina) Mammogram - 06/2015 WNL  Well woman - with OBGYN Dr. Phineas Real every few years  VAGINAL HYSTERECTOMY Date: 2004 heavy bleeding, ovaries remain Flu shot - yearly at work. Td 2009  Sunscreen use discussed. Did have skin cancer screening.  Seat belt use discussed.   Caffeine: 2 cups coffee Widowed, husband died at age 20 of colon cancer. No pets.  Occ: works for [employer] department Activity: walks regularly 4d/wk 2.5 mi Diet: good water, daily fruits/vegetables, doesn't tolerate milk well.   Relevant past medical, surgical, family and social history reviewed and updated as indicated. Interim medical history since our last visit reviewed. Allergies and medications reviewed and updated. Current Outpatient Prescriptions on File Prior to Visit  Medication Sig  . lisinopril-hydrochlorothiazide (PRINZIDE,ZESTORETIC) 10-12.5 MG per tablet Take 1 tablet by mouth every morning.   No current facility-administered medications on file prior to visit.     Review of Systems  Constitutional: Negative for activity change, appetite change, chills, fatigue, fever and unexpected weight change.  HENT: Negative for hearing loss.   Eyes: Negative for visual disturbance.  Respiratory: Negative for cough, chest tightness, shortness of breath and wheezing.   Cardiovascular: Negative for chest pain, palpitations and leg swelling.    Gastrointestinal: Negative for abdominal distention, abdominal pain, blood in stool, constipation, diarrhea, nausea and vomiting.  Genitourinary: Negative for difficulty urinating and hematuria.  Musculoskeletal: Negative for arthralgias, myalgias and neck pain.  Skin: Negative for rash.  Neurological: Positive for headaches. Negative for dizziness, seizures and syncope.  Hematological: Negative for adenopathy. Does not bruise/bleed easily.  Psychiatric/Behavioral: Negative for dysphoric mood. The patient is not nervous/anxious.    Per HPI unless specifically indicated in ROS section     Objective:    BP 132/88 (BP Location: Right Arm, Cuff Size: Normal)   Pulse 70   Temp 98 F (36.7 C) (Oral)   Ht 5\' 6"  (1.676 m)   Wt 160 lb (72.6 kg)   BMI 25.82 kg/m   Wt Readings from Last 3 Encounters:  10/28/15 160 lb (72.6 kg)  10/27/14 157 lb 8 oz (71.4 kg)  09/25/13 163 lb (73.9 kg)    Physical Exam  Constitutional: She is oriented to person, place, and time. She appears well-developed and well-nourished. No distress.  HENT:  Head: Normocephalic and atraumatic.  Right Ear: Hearing, tympanic membrane, external ear and ear canal normal.  Left Ear: Hearing, tympanic membrane, external ear and ear canal normal.  Nose: Nose normal.  Mouth/Throat: Uvula is midline, oropharynx is clear and moist and mucous membranes are normal. No oropharyngeal exudate, posterior oropharyngeal edema or posterior oropharyngeal erythema.  Eyes: Conjunctivae and EOM are normal. Pupils are equal, round, and reactive to light. No scleral icterus.  Neck: Normal range of motion. Neck supple. No thyromegaly present.  Cardiovascular: Normal rate, regular  rhythm, normal heart sounds and intact distal pulses.   No murmur heard. Pulses:      Radial pulses are 2+ on the right side, and 2+ on the left side.  Pulmonary/Chest: Effort normal and breath sounds normal. No respiratory distress. She has no wheezes. She has no  rales.  Abdominal: Soft. Bowel sounds are normal. She exhibits no distension and no mass. There is no tenderness. There is no rebound and no guarding.  Musculoskeletal: Normal range of motion. She exhibits no edema.  Lymphadenopathy:    She has no cervical adenopathy.  Neurological: She is alert and oriented to person, place, and time.  CN grossly intact, station and gait intact  Skin: Skin is warm and dry. No rash noted.  Psychiatric: She has a normal mood and affect. Her behavior is normal. Judgment and thought content normal.  Nursing note and vitals reviewed.      Assessment & Plan:   Problem List Items Addressed This Visit    Essential hypertension    Chronic, stable. Continue current regimen.      Relevant Orders   Comprehensive metabolic panel   TSH   Healthcare maintenance - Primary    Preventative protocols reviewed and updated unless pt declined. Discussed healthy diet and lifestyle.        Other Visit Diagnoses   None.      Follow up plan: Return in about 1 year (around 10/27/2016), or as needed, for annual exam, prior fasting for blood work.  Ria Bush, MD

## 2015-11-05 ENCOUNTER — Other Ambulatory Visit: Payer: Self-pay | Admitting: *Deleted

## 2015-11-05 MED ORDER — LISINOPRIL-HYDROCHLOROTHIAZIDE 10-12.5 MG PO TABS: 1.0000 | ORAL_TABLET | Freq: Every morning | ORAL | 3 refills | Status: DC

## 2016-04-17 ENCOUNTER — Ambulatory Visit: Payer: 59 | Admitting: Gynecology

## 2016-04-19 ENCOUNTER — Ambulatory Visit: Payer: 59 | Admitting: Gynecology

## 2016-05-03 ENCOUNTER — Encounter: Payer: 59 | Admitting: Gynecology

## 2016-05-03 ENCOUNTER — Encounter: Payer: Self-pay | Admitting: Gynecology

## 2016-05-03 ENCOUNTER — Ambulatory Visit (INDEPENDENT_AMBULATORY_CARE_PROVIDER_SITE_OTHER): Payer: 59 | Admitting: Gynecology

## 2016-05-03 VITALS — BP 136/84 | Ht 66.0 in | Wt 161.8 lb

## 2016-05-03 DIAGNOSIS — Z01419 Encounter for gynecological examination (general) (routine) without abnormal findings: Secondary | ICD-10-CM

## 2016-05-03 DIAGNOSIS — N939 Abnormal uterine and vaginal bleeding, unspecified: Secondary | ICD-10-CM

## 2016-05-03 NOTE — Patient Instructions (Signed)
Follow up the same day if you have a recurrence of the vaginal bleeding.

## 2016-05-03 NOTE — Progress Notes (Signed)
    Stacey Bean 10/19/1960 [id number]        56 y.o.  G2P2002 for annual exam.  Noticed about 2 weeks ago a single episode of vaginal spotting with wiping. No pain irritation discharge over. Feels it was vaginal and not hemorrhoidal. No further episodes. Status post TVH in the past.  Past medical history,surgical history, problem list, medications, allergies, family history and social history were all reviewed and documented as reviewed in the EPIC chart.  ROS:  Performed with pertinent positives and negatives included in the history, assessment and plan.   Additional significant findings :  None   Exam: Copywriter, advertising Vitals:   05/03/16 1551  BP: 136/84  Weight: 161 lb 12.8 oz (73.4 kg)  Height: 5\' 6"  (1.676 m)   Body mass index is 26.12 kg/m.  General appearance:  Normal affect, orientation and appearance. Skin: Grossly normal HEENT: Without gross lesions.  No cervical or supraclavicular adenopathy. Thyroid normal.  Lungs:  Clear without wheezing, rales or rhonchi Cardiac: RR, without RMG Abdominal:  Soft, nontender, without masses, guarding, rebound, organomegaly or hernia Breasts:  Examined lying and sitting without masses, retractions, discharge or axillary adenopathy. Pelvic:  Ext, BUS, Vagina: With atrophic changes. No evidence of lesions or bleeding. No evidence of urethral prolapse.  Adnexa: Without masses or tenderness    Anus and perineum: With old external hemorrhoids. No evidence of fissure or bleeding   Rectovaginal: Normal sphincter tone without palpated masses or tenderness.    Assessment/Plan:  56 y.o. G23P2002 female for annual exam.   1. With history of vaginal staining 1 episode with wiping. No findings on physical exam. Pap smear of the vagina was done.  Patient will follow up the same day if she has any recurrent so I can examine her while it's occurring. Status post TVH in the past. 2. Hemorrhoids. Stable. Had hemorrhoidectomy by Dr. Hassell Done in the  past. 3. Mammography coming due in May and I reminded her to schedule. SBE monthly. 4. Pap smear 2015. Pap smear done today as #1. 5. Colonoscopy 2014. Repeat at their recommended interval. 6. Health maintenance. No routine lab work done as this is done elsewhere. Follow up 1 year, sooner as needed.  Anastasio Auerbach MD, 4:04 PM 05/03/2016

## 2016-05-05 LAB — PAP IG W/ RFLX HPV ASCU

## 2016-05-15 ENCOUNTER — Encounter: Payer: Self-pay | Admitting: Gynecology

## 2016-05-15 ENCOUNTER — Ambulatory Visit (INDEPENDENT_AMBULATORY_CARE_PROVIDER_SITE_OTHER): Payer: 59 | Admitting: Gynecology

## 2016-05-15 VITALS — BP 124/76

## 2016-05-15 DIAGNOSIS — B9689 Other specified bacterial agents as the cause of diseases classified elsewhere: Secondary | ICD-10-CM | POA: Diagnosis not present

## 2016-05-15 DIAGNOSIS — N760 Acute vaginitis: Secondary | ICD-10-CM

## 2016-05-15 DIAGNOSIS — N764 Abscess of vulva: Secondary | ICD-10-CM

## 2016-05-15 LAB — WET PREP FOR TRICH, YEAST, CLUE
Trich, Wet Prep: NONE SEEN
Yeast Wet Prep HPF POC: NONE SEEN

## 2016-05-15 MED ORDER — CLINDAMYCIN HCL 300 MG PO CAPS: 300.0000 mg | ORAL_CAPSULE | Freq: Two times a day (BID) | ORAL | 0 refills | Status: DC

## 2016-05-15 NOTE — Progress Notes (Signed)
    Stacey Bean 01/03/1961 [id number]        56 y.o.  G2P2002 presents complaining of swelling and pain right vulva over the last several days. Ultimately drained and feeling much better. No fever or chills. Does have some vaginal itching and discharge. No frequency dysuria or urgency low back pain. No nausea vomiting diarrhea constipation. No history of same before.  Past medical history,surgical history, problem list, medications, allergies, family history and social history were all reviewed and documented in the EPIC chart.  Directed ROS with pertinent positives and negatives documented in the history of present illness/assessment and plan.  Exam: Copywriter, advertising Vitals:   05/15/16 1014  BP: 124/76   General appearance:  Normal Abdomen soft nontender without masses guarding rebound Pelvic external BUS vagina with mildly swollen right labia minora with pinpoint area opening not actively draining. No evidence of cellulitis. No inguinal adenopathy. Vagina with white discharge. Bimanual without masses or tenderness.  Assessment/Plan:  56 y.o. [id number] with history and exam consistent with right labia minora boil that came to ahead and drained. Does not appear as viral. No evidence of cellulitis or more concerning infection. Suspect now will resolve spontaneously. Recommended warm compresses to the area until totally gone. Vaginal discharge consistent with bacterial vaginosis on wet prep. Will cover with Cleocin oral 300 mg twice a day 7 days at patient's preference over Flagyl for vaginal cream. Follow up if symptoms persist, worsen or recur.    Anastasio Auerbach MD, 10:34 AM 05/15/2016

## 2016-05-15 NOTE — Patient Instructions (Signed)
Take the antibiotic twice daily for 7 days. Follow up if the symptoms persist, worsen or recur.

## 2016-05-15 NOTE — Addendum Note (Signed)
Addended by: Thurnell Garbe A on: 05/15/2016 11:38 AM   Modules accepted: Orders

## 2016-05-31 ENCOUNTER — Other Ambulatory Visit: Payer: Self-pay | Admitting: Gynecology

## 2016-05-31 DIAGNOSIS — Z1231 Encounter for screening mammogram for malignant neoplasm of breast: Secondary | ICD-10-CM

## 2016-06-23 ENCOUNTER — Ambulatory Visit: Payer: 59

## 2016-06-29 ENCOUNTER — Ambulatory Visit
Admission: RE | Admit: 2016-06-29 | Discharge: 2016-06-29 | Disposition: A | Discharge status: Home / Self Care | Payer: 59 | Source: Ambulatory Visit | Attending: Gynecology | Admitting: Gynecology

## 2016-06-29 DIAGNOSIS — Z1231 Encounter for screening mammogram for malignant neoplasm of breast: Secondary | ICD-10-CM

## 2016-10-27 DIAGNOSIS — C4491 Basal cell carcinoma of skin, unspecified: Secondary | ICD-10-CM

## 2016-10-27 DIAGNOSIS — C44219 Basal cell carcinoma of skin of left ear and external auricular canal: Secondary | ICD-10-CM | POA: Diagnosis not present

## 2016-10-27 DIAGNOSIS — C44519 Basal cell carcinoma of skin of other part of trunk: Secondary | ICD-10-CM | POA: Diagnosis not present

## 2016-10-27 DIAGNOSIS — C44719 Basal cell carcinoma of skin of left lower limb, including hip: Secondary | ICD-10-CM | POA: Diagnosis not present

## 2016-10-27 HISTORY — DX: Basal cell carcinoma of skin, unspecified: C44.91

## 2016-11-07 ENCOUNTER — Other Ambulatory Visit (INDEPENDENT_AMBULATORY_CARE_PROVIDER_SITE_OTHER): Payer: 59

## 2016-11-07 ENCOUNTER — Other Ambulatory Visit: Payer: Self-pay | Admitting: Family Medicine

## 2016-11-07 DIAGNOSIS — Z1159 Encounter for screening for other viral diseases: Secondary | ICD-10-CM

## 2016-11-07 DIAGNOSIS — I10 Essential (primary) hypertension: Secondary | ICD-10-CM | POA: Diagnosis not present

## 2016-11-07 DIAGNOSIS — Z13220 Encounter for screening for lipoid disorders: Secondary | ICD-10-CM

## 2016-11-07 LAB — BASIC METABOLIC PANEL
BUN: 15 mg/dL (ref 6–23)
CALCIUM: 9.6 mg/dL (ref 8.4–10.5)
CO2: 30 meq/L (ref 19–32)
CREATININE: 0.77 mg/dL (ref 0.40–1.20)
Chloride: 103 mEq/L (ref 96–112)
GFR: 82.98 mL/min (ref >60.00)
Glucose, Bld: 87 mg/dL (ref 70–99)
Potassium: 4 mEq/L (ref 3.5–5.1)
SODIUM: 140 meq/L (ref 135–145)

## 2016-11-07 LAB — LIPID PANEL
CHOLESTEROL: 168 mg/dL (ref 0–200)
HDL: 67.8 mg/dL (ref >39.00)
LDL Cholesterol: 90 mg/dL (ref 0–99)
NonHDL: 100.38
TRIGLYCERIDES: 54 mg/dL (ref 0.0–149.0)
Total CHOL/HDL Ratio: 2
VLDL: 10.8 mg/dL (ref 0.0–40.0)

## 2016-11-08 LAB — HEPATITIS C ANTIBODY
Hepatitis C Ab: NONREACTIVE
SIGNAL TO CUT-OFF: 0.01 (ref <1.00)

## 2016-11-16 ENCOUNTER — Ambulatory Visit (INDEPENDENT_AMBULATORY_CARE_PROVIDER_SITE_OTHER): Payer: 59 | Admitting: Family Medicine

## 2016-11-16 ENCOUNTER — Encounter: Payer: Self-pay | Admitting: Family Medicine

## 2016-11-16 VITALS — BP 118/68 | HR 93 | Temp 98.2°F | Wt 160.2 lb

## 2016-11-16 DIAGNOSIS — I10 Essential (primary) hypertension: Secondary | ICD-10-CM | POA: Diagnosis not present

## 2016-11-16 DIAGNOSIS — Z85828 Personal history of other malignant neoplasm of skin: Secondary | ICD-10-CM

## 2016-11-16 DIAGNOSIS — Z0000 Encounter for general adult medical examination without abnormal findings: Secondary | ICD-10-CM | POA: Diagnosis not present

## 2016-11-16 MED ORDER — LISINOPRIL-HYDROCHLOROTHIAZIDE 10-12.5 MG PO TABS: 1.0000 | ORAL_TABLET | Freq: Every morning | ORAL | 3 refills | Status: DC

## 2016-11-16 NOTE — Progress Notes (Signed)
BP 118/68 (BP Location: Left Arm, Patient Position: Sitting, Cuff Size: Normal)   Pulse 93   Temp 98.2 F (36.8 C) (Oral)   Wt 160 lb 4 oz (72.7 kg)   SpO2 96%   BMI 25.87 kg/m    CC: CPE Subjective:    Patient ID: Stacey Bean, female    DOB: [birth year], 56 y.o.   MRN: [id number]  HPI: Stacey Bean is a 56 y.o. female presenting on 11/16/2016 for Annual Exam   Some hemorrhoidal bleeding. Some constipation.   Preventative: COLONOSCOPY Date: 11/2012 tubular adenoma, mod diverticulosis, int hem, rpt 5 yrs Deatra Ina) Mammogram - 06/2016 WNL  Well woman - with OBGYN Dr. Phineas Real spring - yearly VAGINAL HYSTERECTOMY Date: 2004 heavy bleeding, ovaries remain Flu shot yearly at work  Td 2009  Seat belt use discussed.  Sunscreen use discussed. Did have skin cancer screening. Saw derm - 4 BCC, has f/u planned.  Non smoker Alcohol - a few glasses of wine a week  Caffeine: 2 cups coffee Widowed, husband died at age 61 of colon cancer. No pets.  Occ: works for [employer] department Activity: walks regularly 4d/wk 2.5 mi Diet: good water, daily fruits/vegetables, doesn't tolerate milk well.   Relevant past medical, surgical, family and social history reviewed and updated as indicated. Interim medical history since our last visit reviewed. Allergies and medications reviewed and updated. Outpatient Medications Prior to Visit  Medication Sig Dispense Refill  . clindamycin (CLEOCIN) 300 MG capsule Take 1 capsule (300 mg total) by mouth 2 (two) times daily. 14 capsule 0  . lisinopril-hydrochlorothiazide (PRINZIDE,ZESTORETIC) 10-12.5 MG tablet Take 1 tablet by mouth every morning. 90 tablet 3   No facility-administered medications prior to visit.      Per HPI unless specifically indicated in ROS section below Review of Systems  Constitutional: Negative for activity change, appetite change, chills, fatigue, fever and unexpected weight change.  HENT: Negative for hearing loss.   Eyes:  Negative for visual disturbance.  Respiratory: Negative for cough, chest tightness, shortness of breath and wheezing.   Cardiovascular: Negative for chest pain, palpitations and leg swelling (left ankle s/p remote surgery).  Gastrointestinal: Positive for blood in stool. Negative for abdominal distention, abdominal pain, constipation, diarrhea, nausea and vomiting.  Genitourinary: Negative for difficulty urinating and hematuria.  Musculoskeletal: Negative for arthralgias, myalgias and neck pain.  Skin: Negative for rash.  Neurological: Negative for dizziness, seizures, syncope and headaches.  Hematological: Negative for adenopathy. Does not bruise/bleed easily.  Psychiatric/Behavioral: Negative for dysphoric mood. The patient is not nervous/anxious.        Objective:    BP 118/68 (BP Location: Left Arm, Patient Position: Sitting, Cuff Size: Normal)   Pulse 93   Temp 98.2 F (36.8 C) (Oral)   Wt 160 lb 4 oz (72.7 kg)   SpO2 96%   BMI 25.87 kg/m   Wt Readings from Last 3 Encounters:  11/16/16 160 lb 4 oz (72.7 kg)  05/03/16 161 lb 12.8 oz (73.4 kg)  10/28/15 160 lb (72.6 kg)    Physical Exam  Constitutional: She is oriented to person, place, and time. She appears well-developed and well-nourished. No distress.  HENT:  Head: Normocephalic and atraumatic.  Right Ear: Hearing, tympanic membrane, external ear and ear canal normal.  Left Ear: Hearing, tympanic membrane, external ear and ear canal normal.  Nose: Nose normal.  Mouth/Throat: Uvula is midline, oropharynx is clear and moist and mucous membranes are normal. No oropharyngeal exudate, posterior oropharyngeal edema  or posterior oropharyngeal erythema.  Eyes: Pupils are equal, round, and reactive to light. Conjunctivae and EOM are normal. No scleral icterus.  Neck: Normal range of motion. Neck supple. No thyromegaly present.  Cardiovascular: Normal rate, regular rhythm, normal heart sounds and intact distal pulses.   No murmur  heard. Pulses:      Radial pulses are 2+ on the right side, and 2+ on the left side.  Pulmonary/Chest: Effort normal and breath sounds normal. No respiratory distress. She has no wheezes. She has no rales.  Abdominal: Soft. Bowel sounds are normal. She exhibits no distension and no mass. There is no tenderness. There is no rebound and no guarding.  Musculoskeletal: Normal range of motion. She exhibits no edema.  Lymphadenopathy:    She has no cervical adenopathy.  Neurological: She is alert and oriented to person, place, and time.  CN grossly intact, station and gait intact  Skin: Skin is warm and dry. No rash noted.  Psychiatric: She has a normal mood and affect. Her behavior is normal. Judgment and thought content normal.  Nursing note and vitals reviewed.  Results for orders placed or performed in visit on 63/33/54  Basic metabolic panel  Result Value Ref Range   Sodium 140 135 - 145 mEq/L   Potassium 4.0 3.5 - 5.1 mEq/L   Chloride 103 96 - 112 mEq/L   CO2 30 19 - 32 mEq/L   Glucose, Bld 87 70 - 99 mg/dL   BUN 15 6 - 23 mg/dL   Creatinine, Ser 0.77 0.40 - 1.20 mg/dL   Calcium 9.6 8.4 - 10.5 mg/dL   GFR 82.98 >60.00 mL/min  Lipid panel  Result Value Ref Range   Cholesterol 168 0 - 200 mg/dL   Triglycerides 54.0 0.0 - 149.0 mg/dL   HDL 67.80 >39.00 mg/dL   VLDL 10.8 0.0 - 40.0 mg/dL   LDL Cholesterol 90 0 - 99 mg/dL   Total CHOL/HDL Ratio 2    NonHDL 100.38   Hepatitis C antibody  Result Value Ref Range   Hepatitis C Ab NON-REACTIVE NON-REACTI   SIGNAL TO CUT-OFF 0.01 <1.00      Assessment & Plan:   Problem List Items Addressed This Visit    Essential hypertension    Chronic, stable. Continue current regimen      Relevant Medications   lisinopril-hydrochlorothiazide (PRINZIDE,ZESTORETIC) 10-12.5 MG tablet   Health maintenance examination - Primary    Preventative protocols reviewed and updated unless pt declined. Discussed healthy diet and lifestyle.        History of basal cell cancer    Sees derm.          Follow up plan: Return in about 1 year (around 11/16/2017) for annual exam, prior fasting for blood work.  Ria Bush, MD

## 2016-11-16 NOTE — Assessment & Plan Note (Signed)
Preventative protocols reviewed and updated unless pt declined. Discussed healthy diet and lifestyle.  

## 2016-11-16 NOTE — Patient Instructions (Signed)
Congratulations on healthy lifestyle. Consider daily soluble fiber supplement (like metamucil or benefiber).  Return as needed or in 1 year for next physical.  Health Maintenance, Female Adopting a healthy lifestyle and getting preventive care can go a long way to promote health and wellness. Talk with your health care provider about what schedule of regular examinations is right for you. This is a good chance for you to check in with your provider about disease prevention and staying healthy. In between checkups, there are plenty of things you can do on your own. Experts have done a lot of research about which lifestyle changes and preventive measures are most likely to keep you healthy. Ask your health care provider for more information. Weight and diet Eat a healthy diet  Be sure to include plenty of vegetables, fruits, low-fat dairy products, and lean protein.  Do not eat a lot of foods high in solid fats, added sugars, or salt.  Get regular exercise. This is one of the most important things you can do for your health. ? Most adults should exercise for at least 150 minutes each week. The exercise should increase your heart rate and make you sweat (moderate-intensity exercise). ? Most adults should also do strengthening exercises at least twice a week. This is in addition to the moderate-intensity exercise.  Maintain a healthy weight  Body mass index (BMI) is a measurement that can be used to identify possible weight problems. It estimates body fat based on height and weight. Your health care provider can help determine your BMI and help you achieve or maintain a healthy weight.  For females 73 years of age and older: ? A BMI below 18.5 is considered underweight. ? A BMI of 18.5 to 24.9 is normal. ? A BMI of 25 to 29.9 is considered overweight. ? A BMI of 30 and above is considered obese.  Watch levels of cholesterol and blood lipids  You should start having your blood tested for  lipids and cholesterol at 54 years of age, then have this test every 5 years.  You may need to have your cholesterol levels checked more often if: ? Your lipid or cholesterol levels are high. ? You are older than 54 years of age. ? You are at high risk for heart disease.  Cancer screening Lung Cancer  Lung cancer screening is recommended for adults 70-52 years old who are at high risk for lung cancer because of a history of smoking.  A yearly low-dose CT scan of the lungs is recommended for people who: ? Currently smoke. ? Have quit within the past 15 years. ? Have at least a 30-pack-year history of smoking. A pack year is smoking an average of one pack of cigarettes a day for 1 year.  Yearly screening should continue until it has been 15 years since you quit.  Yearly screening should stop if you develop a health problem that would prevent you from having lung cancer treatment.  Breast Cancer  Practice breast self-awareness. This means understanding how your breasts normally appear and feel.  It also means doing regular breast self-exams. Let your health care provider know about any changes, no matter how small.  If you are in your 20s or 30s, you should have a clinical breast exam (CBE) by a health care provider every 1-3 years as part of a regular health exam.  If you are 18 or older, have a CBE every year. Also consider having a breast X-ray (mammogram) every year.  If you have a family history of breast cancer, talk to your health care provider about genetic screening.  If you are at high risk for breast cancer, talk to your health care provider about having an MRI and a mammogram every year.  Breast cancer gene (BRCA) assessment is recommended for women who have family members with BRCA-related cancers. BRCA-related cancers include: ? Breast. ? Ovarian. ? Tubal. ? Peritoneal cancers.  Results of the assessment will determine the need for genetic counseling and BRCA1 and  BRCA2 testing.  Cervical Cancer Your health care provider may recommend that you be screened regularly for cancer of the pelvic organs (ovaries, uterus, and vagina). This screening involves a pelvic examination, including checking for microscopic changes to the surface of your cervix (Pap test). You may be encouraged to have this screening done every 3 years, beginning at age 66.  For women ages 28-65, health care providers may recommend pelvic exams and Pap testing every 3 years, or they may recommend the Pap and pelvic exam, combined with testing for human papilloma virus (HPV), every 5 years. Some types of HPV increase your risk of cervical cancer. Testing for HPV may also be done on women of any age with unclear Pap test results.  Other health care providers may not recommend any screening for nonpregnant women who are considered low risk for pelvic cancer and who do not have symptoms. Ask your health care provider if a screening pelvic exam is right for you.  If you have had past treatment for cervical cancer or a condition that could lead to cancer, you need Pap tests and screening for cancer for at least 20 years after your treatment. If Pap tests have been discontinued, your risk factors (such as having a new sexual partner) need to be reassessed to determine if screening should resume. Some women have medical problems that increase the chance of getting cervical cancer. In these cases, your health care provider may recommend more frequent screening and Pap tests.  Colorectal Cancer  This type of cancer can be detected and often prevented.  Routine colorectal cancer screening usually begins at 54 years of age and continues through 54 years of age.  Your health care provider may recommend screening at an earlier age if you have risk factors for colon cancer.  Your health care provider may also recommend using home test kits to check for hidden blood in the stool.  A small camera at the  end of a tube can be used to examine your colon directly (sigmoidoscopy or colonoscopy). This is done to check for the earliest forms of colorectal cancer.  Routine screening usually begins at age 3.  Direct examination of the colon should be repeated every 5-10 years through 54 years of age. However, you may need to be screened more often if early forms of precancerous polyps or small growths are found.  Skin Cancer  Check your skin from head to toe regularly.  Tell your health care provider about any new moles or changes in moles, especially if there is a change in a mole's shape or color.  Also tell your health care provider if you have a mole that is larger than the size of a pencil eraser.  Always use sunscreen. Apply sunscreen liberally and repeatedly throughout the day.  Protect yourself by wearing long sleeves, pants, a wide-brimmed hat, and sunglasses whenever you are outside.  Heart disease, diabetes, and high blood pressure  High blood pressure causes heart disease and  the risk of stroke. High blood pressure is more likely to develop in: ? People who have blood pressure in the high end of the normal range (130-139/85-89 mm Hg). ? People who are overweight or obese. ? People who are African American.  If you are 18-39 years of age, have your blood pressure checked every 3-5 years. If you are 40 years of age or older, have your blood pressure checked every year. You should have your blood pressure measured twice-once when you are at a hospital or clinic, and once when you are not at a hospital or clinic. Record the average of the two measurements. To check your blood pressure when you are not at a hospital or clinic, you can use: ? An automated blood pressure machine at a pharmacy. ? A home blood pressure monitor.  If you are between 55 years and 79 years old, ask your health care provider if you should take aspirin to prevent strokes.  Have regular diabetes screenings. This  involves taking a blood sample to check your fasting blood sugar level. ? If you are at a normal weight and have a low risk for diabetes, have this test once every three years after 54 years of age. ? If you are overweight and have a high risk for diabetes, consider being tested at a younger age or more often. Preventing infection Hepatitis B  If you have a higher risk for hepatitis B, you should be screened for this virus. You are considered at high risk for hepatitis B if: ? You were born in a country where hepatitis B is common. Ask your health care provider which countries are considered high risk. ? Your parents were born in a high-risk country, and you have not been immunized against hepatitis B (hepatitis B vaccine). ? You have HIV or AIDS. ? You use needles to inject street drugs. ? You live with someone who has hepatitis B. ? You have had sex with someone who has hepatitis B. ? You get hemodialysis treatment. ? You take certain medicines for conditions, including cancer, organ transplantation, and autoimmune conditions.  Hepatitis C  Blood testing is recommended for: ? Everyone born from 1945 through 1965. ? Anyone with known risk factors for hepatitis C.  Sexually transmitted infections (STIs)  You should be screened for sexually transmitted infections (STIs) including gonorrhea and chlamydia if: ? You are sexually active and are younger than 54 years of age. ? You are older than 54 years of age and your health care provider tells you that you are at risk for this type of infection. ? Your sexual activity has changed since you were last screened and you are at an increased risk for chlamydia or gonorrhea. Ask your health care provider if you are at risk.  If you do not have HIV, but are at risk, it may be recommended that you take a prescription medicine daily to prevent HIV infection. This is called pre-exposure prophylaxis (PrEP). You are considered at risk if: ? You are  sexually active and do not regularly use condoms or know the HIV status of your partner(s). ? You take drugs by injection. ? You are sexually active with a partner who has HIV.  Talk with your health care provider about whether you are at high risk of being infected with HIV. If you choose to begin PrEP, you should first be tested for HIV. You should then be tested every 3 months for as long as you are taking PrEP.   Pregnancy  If you are premenopausal and you may become pregnant, ask your health care provider about preconception counseling.  If you may become pregnant, take 400 to 800 micrograms (mcg) of folic acid every day.  If you want to prevent pregnancy, talk to your health care provider about birth control (contraception). Osteoporosis and menopause  Osteoporosis is a disease in which the bones lose minerals and strength with aging. This can result in serious bone fractures. Your risk for osteoporosis can be identified using a bone density scan.  If you are 65 years of age or older, or if you are at risk for osteoporosis and fractures, ask your health care provider if you should be screened.  Ask your health care provider whether you should take a calcium or vitamin D supplement to lower your risk for osteoporosis.  Menopause may have certain physical symptoms and risks.  Hormone replacement therapy may reduce some of these symptoms and risks. Talk to your health care provider about whether hormone replacement therapy is right for you. Follow these instructions at home:  Schedule regular health, dental, and eye exams.  Stay current with your immunizations.  Do not use any tobacco products including cigarettes, chewing tobacco, or electronic cigarettes.  If you are pregnant, do not drink alcohol.  If you are breastfeeding, limit how much and how often you drink alcohol.  Limit alcohol intake to no more than 1 drink per day for nonpregnant women. One drink equals 12 ounces of  beer, 5 ounces of wine, or 1 ounces of hard liquor.  Do not use street drugs.  Do not share needles.  Ask your health care provider for help if you need support or information about quitting drugs.  Tell your health care provider if you often feel depressed.  Tell your health care provider if you have ever been abused or do not feel safe at home. This information is not intended to replace advice given to you by your health care provider. Make sure you discuss any questions you have with your health care provider. Document Released: 08/22/2010 Document Revised: 07/15/2015 Document Reviewed: 11/10/2014 [employer] Interactive Patient Education  2018 [employer]  

## 2016-11-16 NOTE — Assessment & Plan Note (Signed)
Chronic, stable. Continue current regimen. 

## 2016-11-16 NOTE — Assessment & Plan Note (Signed)
Sees derm

## 2016-11-23 DIAGNOSIS — C44219 Basal cell carcinoma of skin of left ear and external auricular canal: Secondary | ICD-10-CM | POA: Diagnosis not present

## 2016-11-23 DIAGNOSIS — C44519 Basal cell carcinoma of skin of other part of trunk: Secondary | ICD-10-CM | POA: Diagnosis not present

## 2016-11-26 DIAGNOSIS — Z23 Encounter for immunization: Secondary | ICD-10-CM | POA: Diagnosis not present

## 2017-06-20 ENCOUNTER — Other Ambulatory Visit: Payer: Self-pay | Admitting: Gynecology

## 2017-06-20 DIAGNOSIS — Z1231 Encounter for screening mammogram for malignant neoplasm of breast: Secondary | ICD-10-CM

## 2017-07-11 ENCOUNTER — Ambulatory Visit
Admission: RE | Admit: 2017-07-11 | Discharge: 2017-07-11 | Disposition: A | Discharge status: Home / Self Care | Payer: 59 | Source: Ambulatory Visit | Attending: Gynecology | Admitting: Gynecology

## 2017-07-11 DIAGNOSIS — Z1231 Encounter for screening mammogram for malignant neoplasm of breast: Secondary | ICD-10-CM

## 2017-10-09 DIAGNOSIS — M1612 Unilateral primary osteoarthritis, left hip: Secondary | ICD-10-CM | POA: Diagnosis not present

## 2017-10-09 DIAGNOSIS — M25652 Stiffness of left hip, not elsewhere classified: Secondary | ICD-10-CM | POA: Diagnosis not present

## 2017-10-09 DIAGNOSIS — M7062 Trochanteric bursitis, left hip: Secondary | ICD-10-CM | POA: Diagnosis not present

## 2017-10-12 DIAGNOSIS — M1612 Unilateral primary osteoarthritis, left hip: Secondary | ICD-10-CM | POA: Diagnosis not present

## 2017-10-12 DIAGNOSIS — M25652 Stiffness of left hip, not elsewhere classified: Secondary | ICD-10-CM | POA: Diagnosis not present

## 2017-10-15 DIAGNOSIS — Z23 Encounter for immunization: Secondary | ICD-10-CM | POA: Diagnosis not present

## 2017-10-16 DIAGNOSIS — M25652 Stiffness of left hip, not elsewhere classified: Secondary | ICD-10-CM | POA: Diagnosis not present

## 2017-10-16 DIAGNOSIS — M1612 Unilateral primary osteoarthritis, left hip: Secondary | ICD-10-CM | POA: Diagnosis not present

## 2017-10-18 DIAGNOSIS — M1612 Unilateral primary osteoarthritis, left hip: Secondary | ICD-10-CM | POA: Diagnosis not present

## 2017-10-18 DIAGNOSIS — M25652 Stiffness of left hip, not elsewhere classified: Secondary | ICD-10-CM | POA: Diagnosis not present

## 2017-10-23 DIAGNOSIS — M25652 Stiffness of left hip, not elsewhere classified: Secondary | ICD-10-CM | POA: Diagnosis not present

## 2017-10-23 DIAGNOSIS — M1612 Unilateral primary osteoarthritis, left hip: Secondary | ICD-10-CM | POA: Diagnosis not present

## 2017-10-25 DIAGNOSIS — M1612 Unilateral primary osteoarthritis, left hip: Secondary | ICD-10-CM | POA: Diagnosis not present

## 2017-10-25 DIAGNOSIS — M25652 Stiffness of left hip, not elsewhere classified: Secondary | ICD-10-CM | POA: Diagnosis not present

## 2017-11-16 ENCOUNTER — Other Ambulatory Visit (INDEPENDENT_AMBULATORY_CARE_PROVIDER_SITE_OTHER): Payer: 59

## 2017-11-16 ENCOUNTER — Other Ambulatory Visit: Payer: Self-pay | Admitting: Family Medicine

## 2017-11-16 DIAGNOSIS — I10 Essential (primary) hypertension: Secondary | ICD-10-CM | POA: Diagnosis not present

## 2017-11-16 LAB — COMPREHENSIVE METABOLIC PANEL
ALK PHOS: 50 U/L (ref 39–117)
ALT: 6 U/L (ref 0–35)
AST: 14 U/L (ref 0–37)
Albumin: 4 g/dL (ref 3.5–5.2)
BUN: 13 mg/dL (ref 6–23)
CALCIUM: 9.2 mg/dL (ref 8.4–10.5)
CO2: 30 meq/L (ref 19–32)
Chloride: 105 mEq/L (ref 96–112)
Creatinine, Ser: 0.88 mg/dL (ref 0.40–1.20)
GFR: 70.86 mL/min (ref >60.00)
Glucose, Bld: 84 mg/dL (ref 70–99)
POTASSIUM: 3.6 meq/L (ref 3.5–5.1)
Sodium: 141 mEq/L (ref 135–145)
TOTAL PROTEIN: 7 g/dL (ref 6.0–8.3)
Total Bilirubin: 0.5 mg/dL (ref 0.2–1.2)

## 2017-11-16 LAB — HEMOGLOBIN A1C: HEMOGLOBIN A1C: 5.5 % (ref 4.6–6.5)

## 2017-11-16 LAB — LIPID PANEL
Cholesterol: 180 mg/dL (ref 0–200)
HDL: 55.1 mg/dL (ref >39.00)
LDL Cholesterol: 113 mg/dL — ABNORMAL HIGH (ref 0–99)
NonHDL: 124.48
TRIGLYCERIDES: 58 mg/dL (ref 0.0–149.0)
Total CHOL/HDL Ratio: 3
VLDL: 11.6 mg/dL (ref 0.0–40.0)

## 2017-11-21 ENCOUNTER — Ambulatory Visit (INDEPENDENT_AMBULATORY_CARE_PROVIDER_SITE_OTHER): Payer: 59 | Admitting: Family Medicine

## 2017-11-21 ENCOUNTER — Encounter: Payer: Self-pay | Admitting: Family Medicine

## 2017-11-21 VITALS — BP 120/82 | HR 63 | Temp 98.4°F | Ht 65.5 in | Wt 159.8 lb

## 2017-11-21 DIAGNOSIS — I10 Essential (primary) hypertension: Secondary | ICD-10-CM | POA: Diagnosis not present

## 2017-11-21 DIAGNOSIS — Z1211 Encounter for screening for malignant neoplasm of colon: Secondary | ICD-10-CM | POA: Diagnosis not present

## 2017-11-21 DIAGNOSIS — Z0000 Encounter for general adult medical examination without abnormal findings: Secondary | ICD-10-CM | POA: Diagnosis not present

## 2017-11-21 MED ORDER — LISINOPRIL-HYDROCHLOROTHIAZIDE 10-12.5 MG PO TABS: 1.0000 | ORAL_TABLET | Freq: Every morning | ORAL | 3 refills | Status: DC

## 2017-11-21 MED ORDER — LISINOPRIL 10 MG PO TABS: 10.0000 mg | ORAL_TABLET | Freq: Every day | ORAL | 3 refills | Status: DC

## 2017-11-21 NOTE — Patient Instructions (Addendum)
Try plain lisinopril 59m daily - 1 month supply sent to pharmacy with few refills. If BP well controlled on this alone, may continue this - let me know and I'll send a year supply. If BP starts creeping up, fill prescription provided today for previous combo pill.  You are doing well today Return in 1 year for next physical.  Health Maintenance, Female Adopting a healthy lifestyle and getting preventive care can go a long way to promote health and wellness. Talk with your health care provider about what schedule of regular examinations is right for you. This is a good chance for you to check in with your provider about disease prevention and staying healthy. In between checkups, there are plenty of things you can do on your own. Experts have done a lot of research about which lifestyle changes and preventive measures are most likely to keep you healthy. Ask your health care provider for more information. Weight and diet Eat a healthy diet  Be sure to include plenty of vegetables, fruits, low-fat dairy products, and lean protein.  Do not eat a lot of foods high in solid fats, added sugars, or salt.  Get regular exercise. This is one of the most important things you can do for your health. ? Most adults should exercise for at least 150 minutes each week. The exercise should increase your heart rate and make you sweat (moderate-intensity exercise). ? Most adults should also do strengthening exercises at least twice a week. This is in addition to the moderate-intensity exercise.  Maintain a healthy weight  Body mass index (BMI) is a measurement that can be used to identify possible weight problems. It estimates body fat based on height and weight. Your health care provider can help determine your BMI and help you achieve or maintain a healthy weight.  For females 23years of age and older: ? A BMI below 18.5 is considered underweight. ? A BMI of 18.5 to 24.9 is normal. ? A BMI of 25 to 29.9 is  considered overweight. ? A BMI of 30 and above is considered obese.  Watch levels of cholesterol and blood lipids  You should start having your blood tested for lipids and cholesterol at 55years of age, then have this test every 5 years.  You may need to have your cholesterol levels checked more often if: ? Your lipid or cholesterol levels are high. ? You are older than 55years of age. ? You are at high risk for heart disease.  Cancer screening Lung Cancer  Lung cancer screening is recommended for adults [id number] old who are at high risk for lung cancer because of a history of smoking.  A yearly low-dose CT scan of the lungs is recommended for people who: ? Currently smoke. ? Have quit within the past 15 years. ? Have at least a 30-pack-year history of smoking. A pack year is smoking an average of one pack of cigarettes a day for 1 year.  Yearly screening should continue until it has been 15 years since you quit.  Yearly screening should stop if you develop a health problem that would prevent you from having lung cancer treatment.  Breast Cancer  Practice breast self-awareness. This means understanding how your breasts normally appear and feel.  It also means doing regular breast self-exams. Let your health care provider know about any changes, no matter how small.  If you are in your 20s or 30s, you should have a clinical breast exam (CBE)  by a health care provider every 1-3 years as part of a regular health exam.  If you are 40 or older, have a CBE every year. Also consider having a breast X-ray (mammogram) every year.  If you have a family history of breast cancer, talk to your health care provider about genetic screening.  If you are at high risk for breast cancer, talk to your health care provider about having an MRI and a mammogram every year.  Breast cancer gene (BRCA) assessment is recommended for women who have family members with BRCA-related cancers.  BRCA-related cancers include: ? Breast. ? Ovarian. ? Tubal. ? Peritoneal cancers.  Results of the assessment will determine the need for genetic counseling and BRCA1 and BRCA2 testing.  Cervical Cancer Your health care provider may recommend that you be screened regularly for cancer of the pelvic organs (ovaries, uterus, and vagina). This screening involves a pelvic examination, including checking for microscopic changes to the surface of your cervix (Pap test). You may be encouraged to have this screening done every 3 years, beginning at age 21.  For women ages 30-65, health care providers may recommend pelvic exams and Pap testing every 3 years, or they may recommend the Pap and pelvic exam, combined with testing for human papilloma virus (HPV), every 5 years. Some types of HPV increase your risk of cervical cancer. Testing for HPV may also be done on women of any age with unclear Pap test results.  Other health care providers may not recommend any screening for nonpregnant women who are considered low risk for pelvic cancer and who do not have symptoms. Ask your health care provider if a screening pelvic exam is right for you.  If you have had past treatment for cervical cancer or a condition that could lead to cancer, you need Pap tests and screening for cancer for at least 20 years after your treatment. If Pap tests have been discontinued, your risk factors (such as having a new sexual partner) need to be reassessed to determine if screening should resume. Some women have medical problems that increase the chance of getting cervical cancer. In these cases, your health care provider may recommend more frequent screening and Pap tests.  Colorectal Cancer  This type of cancer can be detected and often prevented.  Routine colorectal cancer screening usually begins at 55 years of age and continues through 55 years of age.  Your health care provider may recommend screening at an earlier age if  you have risk factors for colon cancer.  Your health care provider may also recommend using home test kits to check for hidden blood in the stool.  A small camera at the end of a tube can be used to examine your colon directly (sigmoidoscopy or colonoscopy). This is done to check for the earliest forms of colorectal cancer.  Routine screening usually begins at age 50.  Direct examination of the colon should be repeated every 5-10 years through 55 years of age. However, you may need to be screened more often if early forms of precancerous polyps or small growths are found.  Skin Cancer  Check your skin from head to toe regularly.  Tell your health care provider about any new moles or changes in moles, especially if there is a change in a mole's shape or color.  Also tell your health care provider if you have a mole that is larger than the size of a pencil eraser.  Always use sunscreen. Apply sunscreen liberally and   repeatedly throughout the day.  Protect yourself by wearing long sleeves, pants, a wide-brimmed hat, and sunglasses whenever you are outside.  Heart disease, diabetes, and high blood pressure  High blood pressure causes heart disease and increases the risk of stroke. High blood pressure is more likely to develop in: ? People who have blood pressure in the high end of the normal range (130-139/85-89 mm Hg). ? People who are overweight or obese. ? People who are African American.  If you are 24-58 years of age, have your blood pressure checked every 3-5 years. If you are 26 years of age or older, have your blood pressure checked every year. You should have your blood pressure measured twice-once when you are at a hospital or clinic, and once when you are not at a hospital or clinic. Record the average of the two measurements. To check your blood pressure when you are not at a hospital or clinic, you can use: ? An automated blood pressure machine at a pharmacy. ? A home blood  pressure monitor.  If you are between 65 years and 94 years old, ask your health care provider if you should take aspirin to prevent strokes.  Have regular diabetes screenings. This involves taking a blood sample to check your fasting blood sugar level. ? If you are at a normal weight and have a low risk for diabetes, have this test once every three years after 55 years of age. ? If you are overweight and have a high risk for diabetes, consider being tested at a younger age or more often. Preventing infection Hepatitis B  If you have a higher risk for hepatitis B, you should be screened for this virus. You are considered at high risk for hepatitis B if: ? You were born in a country where hepatitis B is common. Ask your health care provider which countries are considered high risk. ? Your parents were born in a high-risk country, and you have not been immunized against hepatitis B (hepatitis B vaccine). ? You have HIV or AIDS. ? You use needles to inject street drugs. ? You live with someone who has hepatitis B. ? You have had sex with someone who has hepatitis B. ? You get hemodialysis treatment. ? You take certain medicines for conditions, including cancer, organ transplantation, and autoimmune conditions.  Hepatitis C  Blood testing is recommended for: ? Everyone born from [birth year] through 1963. ? Anyone with known risk factors for hepatitis C.  Sexually transmitted infections (STIs)  You should be screened for sexually transmitted infections (STIs) including gonorrhea and chlamydia if: ? You are sexually active and are younger than 55 years of age. ? You are older than 55 years of age and your health care provider tells you that you are at risk for this type of infection. ? Your sexual activity has changed since you were last screened and you are at an increased risk for chlamydia or gonorrhea. Ask your health care provider if you are at risk.  If you do not have HIV, but are at risk,  it may be recommended that you take a prescription medicine daily to prevent HIV infection. This is called pre-exposure prophylaxis (PrEP). You are considered at risk if: ? You are sexually active and do not regularly use condoms or know the HIV status of your partner(s). ? You take drugs by injection. ? You are sexually active with a partner who has HIV.  Talk with your health care provider about whether you  are at high risk of being infected with HIV. If you choose to begin PrEP, you should first be tested for HIV. You should then be tested every 3 months for as long as you are taking PrEP. Pregnancy  If you are premenopausal and you may become pregnant, ask your health care provider about preconception counseling.  If you may become pregnant, take 400 to 800 micrograms (mcg) of folic acid every day.  If you want to prevent pregnancy, talk to your health care provider about birth control (contraception). Osteoporosis and menopause  Osteoporosis is a disease in which the bones lose minerals and strength with aging. This can result in serious bone fractures. Your risk for osteoporosis can be identified using a bone density scan.  If you are 56 years of age or older, or if you are at risk for osteoporosis and fractures, ask your health care provider if you should be screened.  Ask your health care provider whether you should take a calcium or vitamin D supplement to lower your risk for osteoporosis.  Menopause may have certain physical symptoms and risks.  Hormone replacement therapy may reduce some of these symptoms and risks. Talk to your health care provider about whether hormone replacement therapy is right for you. Follow these instructions at home:  Schedule regular health, dental, and eye exams.  Stay current with your immunizations.  Do not use any tobacco products including cigarettes, chewing tobacco, or electronic cigarettes.  If you are pregnant, do not drink  alcohol.  If you are breastfeeding, limit how much and how often you drink alcohol.  Limit alcohol intake to no more than 1 drink per day for nonpregnant women. One drink equals 12 ounces of beer, 5 ounces of wine, or 1 ounces of hard liquor.  Do not use street drugs.  Do not share needles.  Ask your health care provider for help if you need support or information about quitting drugs.  Tell your health care provider if you often feel depressed.  Tell your health care provider if you have ever been abused or do not feel safe at home. This information is not intended to replace advice given to you by your health care provider. Make sure you discuss any questions you have with your health care provider. Document Released: 08/22/2010 Document Revised: 07/15/2015 Document Reviewed: 11/10/2014 Elsevier Interactive Patient Education  [employer].

## 2017-11-21 NOTE — Progress Notes (Signed)
BP 120/82 (BP Location: Left Arm, Patient Position: Sitting, Cuff Size: Normal)   Pulse 63   Temp 98.4 F (36.9 C) (Oral)   Ht 5' 5.5" (1.664 m)   Wt 159 lb 12 oz (72.5 kg)   SpO2 97%   BMI 26.18 kg/m    CC: CPE Subjective:    Patient ID: Stacey Bean, female    DOB: 15-Feb-1961, 57 y.o.   MRN: [id number]  HPI: Stacey Bean is a 57 y.o. female presenting on 11/21/2017 for Annual Exam   Home BP readings very well controlled - 110s/60-70s - wonders if she needs BP medication.   Preventative: COLONOSCOPY Date: 11/2012 tubular adenoma, mod diverticulosis, int hem, rpt 5 yrs Deatra Ina) - due this month. Has not received recall letter. Will refer to [facility] Mammogram - 06/2017 WNL  Well woman - with OBGYN Dr. Phineas Real [facility] - every few years VAGINAL HYSTERECTOMY Date: 2004 heavy bleeding, ovaries remain Flu shot yearly at work  Td 2009, Tdap 2018 Seat belt use discussed.  Sunscreen use discussed. Did have skin cancer screening. Saw derm - 4 BCC, has f/u planned.  Non smoker Alcohol - a few glasses of wine a week  Dentist Q6 mo Eye exam yearly  Caffeine: 2 cups coffee Widowed, husband died at age 70 of colon cancer. No pets.  Occ: works for [employer] department Activity: walks regularly 4d/wk 2.5 mi Diet: good water, daily fruits/vegetables, doesn't tolerate milk well  Relevant past medical, surgical, family and social history reviewed and updated as indicated. Interim medical history since our last visit reviewed. Allergies and medications reviewed and updated. Outpatient Medications Prior to Visit  Medication Sig Dispense Refill  . lisinopril-hydrochlorothiazide (PRINZIDE,ZESTORETIC) 10-12.5 MG tablet Take 1 tablet by mouth every morning. 90 tablet 3   No facility-administered medications prior to visit.      Per HPI unless specifically indicated in ROS section below Review of Systems  Constitutional: Negative for activity change, appetite change, chills, fatigue, fever and  unexpected weight change.  HENT: Negative for hearing loss.   Eyes: Negative for visual disturbance.  Respiratory: Negative for cough, chest tightness, shortness of breath and wheezing.   Cardiovascular: Negative for chest pain, palpitations and leg swelling.  Gastrointestinal: Negative for abdominal distention, abdominal pain, blood in stool, constipation, diarrhea, nausea and vomiting.  Genitourinary: Negative for difficulty urinating and hematuria.  Musculoskeletal: Negative for arthralgias, myalgias and neck pain.  Skin: Negative for rash.  Neurological: Negative for dizziness, seizures, syncope and headaches.  Hematological: Negative for adenopathy. Does not bruise/bleed easily.  Psychiatric/Behavioral: Negative for dysphoric mood. The patient is not nervous/anxious.        Objective:    BP 120/82 (BP Location: Left Arm, Patient Position: Sitting, Cuff Size: Normal)   Pulse 63   Temp 98.4 F (36.9 C) (Oral)   Ht 5' 5.5" (1.664 m)   Wt 159 lb 12 oz (72.5 kg)   SpO2 97%   BMI 26.18 kg/m   Wt Readings from Last 3 Encounters:  11/21/17 159 lb 12 oz (72.5 kg)  11/16/16 160 lb 4 oz (72.7 kg)  05/03/16 161 lb 12.8 oz (73.4 kg)    Physical Exam  Constitutional: She is oriented to person, place, and time. She appears well-developed and well-nourished. No distress.  HENT:  Head: Normocephalic and atraumatic.  Right Ear: Hearing, tympanic membrane, external ear and ear canal normal.  Left Ear: Hearing, tympanic membrane, external ear and ear canal normal.  Nose: Nose normal.  Mouth/Throat:  Uvula is midline, oropharynx is clear and moist and mucous membranes are normal. No oropharyngeal exudate, posterior oropharyngeal edema or posterior oropharyngeal erythema.  Eyes: Pupils are equal, round, and reactive to light. Conjunctivae and EOM are normal. No scleral icterus.  Neck: Normal range of motion. Neck supple.  Cardiovascular: Normal rate, regular rhythm, normal heart sounds and  intact distal pulses.  No murmur heard. Pulses:      Radial pulses are 2+ on the right side, and 2+ on the left side.  Pulmonary/Chest: Effort normal and breath sounds normal. No respiratory distress. She has no wheezes. She has no rales.  Abdominal: Soft. Bowel sounds are normal. She exhibits no distension and no mass. There is no tenderness. There is no rebound and no guarding.  Musculoskeletal: Normal range of motion. She exhibits no edema.  Lymphadenopathy:    She has no cervical adenopathy.  Neurological: She is alert and oriented to person, place, and time.  CN grossly intact, station and gait intact  Skin: Skin is warm and dry. No rash noted.  Psychiatric: She has a normal mood and affect. Her behavior is normal. Judgment and thought content normal.  Nursing note and vitals reviewed.  Results for orders placed or performed in visit on 11/16/17  Hemoglobin A1c  Result Value Ref Range   Hgb A1c MFr Bld 5.5 4.6 - 6.5 %  Comprehensive metabolic panel  Result Value Ref Range   Sodium 141 135 - 145 mEq/L   Potassium 3.6 3.5 - 5.1 mEq/L   Chloride 105 96 - 112 mEq/L   CO2 30 19 - 32 mEq/L   Glucose, Bld 84 70 - 99 mg/dL   BUN 13 6 - 23 mg/dL   Creatinine, Ser 0.88 0.40 - 1.20 mg/dL   Total Bilirubin 0.5 0.2 - 1.2 mg/dL   Alkaline Phosphatase 50 39 - 117 U/L   AST 14 0 - 37 U/L   ALT 6 0 - 35 U/L   Total Protein 7.0 6.0 - 8.3 g/dL   Albumin 4.0 3.5 - 5.2 g/dL   Calcium 9.2 8.4 - 10.5 mg/dL   GFR 70.86 >60.00 mL/min  Lipid panel  Result Value Ref Range   Cholesterol 180 0 - 200 mg/dL   Triglycerides 58.0 0.0 - 149.0 mg/dL   HDL 55.10 >39.00 mg/dL   VLDL 11.6 0.0 - 40.0 mg/dL   LDL Cholesterol 113 (H) 0 - 99 mg/dL   Total CHOL/HDL Ratio 3    NonHDL 124.48       Assessment & Plan:   Problem List Items Addressed This Visit    Health maintenance examination - Primary    Preventative protocols reviewed and updated unless pt declined. Discussed healthy diet and lifestyle.         Essential hypertension    Chronic, stable. Pt interested in trial of less medication which is reasonable - will send in local supply of plain lisinopril 10mg  to take daily, pt to monitor blood pressures at home and update me with effect. If tolerated well, will send in year supply to pharmacy. If BP trending up, printed Rx for current regimen to fill. Patient agrees with plan.       Relevant Medications   lisinopril-hydrochlorothiazide (PRINZIDE,ZESTORETIC) 10-12.5 MG tablet   lisinopril (PRINIVIL,ZESTRIL) 10 MG tablet    Other Visit Diagnoses    Special screening for malignant neoplasms, colon       Relevant Orders   Ambulatory referral to Gastroenterology       Meds  ordered this encounter  Medications  . lisinopril-hydrochlorothiazide (PRINZIDE,ZESTORETIC) 10-12.5 MG tablet    Sig: Take 1 tablet by mouth every morning.    Dispense:  90 tablet    Refill:  3  . lisinopril (PRINIVIL,ZESTRIL) 10 MG tablet    Sig: Take 1 tablet (10 mg total) by mouth daily.    Dispense:  30 tablet    Refill:  3   Orders Placed This Encounter  Procedures  . Ambulatory referral to Gastroenterology    Referral Priority:   Routine    Referral Type:   Consultation    Referral Reason:   Specialty Services Required    Number of Visits Requested:   1    Follow up plan: Return in about 1 year (around 11/22/2018) for annual exam, prior fasting for blood work.  Ria Bush, MD

## 2017-11-21 NOTE — Assessment & Plan Note (Addendum)
Chronic, stable. Pt interested in trial of less medication which is reasonable - will send in local supply of plain lisinopril 10mg  to take daily, pt to monitor blood pressures at home and update me with effect. If tolerated well, will send in year supply to pharmacy. If BP trending up, printed Rx for current regimen to fill. Patient agrees with plan.

## 2017-11-21 NOTE — Assessment & Plan Note (Signed)
Preventative protocols reviewed and updated unless pt declined. Discussed healthy diet and lifestyle.  

## 2017-11-23 ENCOUNTER — Encounter: Payer: Self-pay | Admitting: Gastroenterology

## 2017-12-17 ENCOUNTER — Encounter: Payer: Self-pay | Admitting: Gastroenterology

## 2017-12-18 ENCOUNTER — Ambulatory Visit (AMBULATORY_SURGERY_CENTER): Payer: Self-pay | Admitting: *Deleted

## 2017-12-18 ENCOUNTER — Encounter: Payer: Self-pay | Admitting: Gastroenterology

## 2017-12-18 VITALS — Ht 66.0 in | Wt 160.0 lb

## 2017-12-18 DIAGNOSIS — Z86010 Personal history of colonic polyps: Secondary | ICD-10-CM

## 2017-12-18 MED ORDER — PEG-KCL-NACL-NASULF-NA ASC-C 140 G PO SOLR: 1.0000 | Freq: Once | ORAL | 0 refills | Status: AC

## 2017-12-18 NOTE — Progress Notes (Signed)
Patient denies any allergies to eggs or soy. Patient denies any problems with anesthesia/sedation. Patient denies any oxygen use at home. Patient denies taking any diet/weight loss medications or blood thinners. EMMI education offered, pt declined. Plenvu universal co pay paper given to pt.

## 2017-12-21 HISTORY — PX: COLONOSCOPY: SHX174

## 2017-12-24 ENCOUNTER — Telehealth: Payer: Self-pay | Admitting: Gastroenterology

## 2017-12-24 NOTE — Telephone Encounter (Signed)
[place], line busy. Then I called the patient. She has not been in to given the Plenvu universal co-pay card that was given to her in [place]. She will take that to her pharmacy. I explained that the Plenvu should be $50 or less with that coupon. Pt will call us back if she has any concerns.

## 2018-01-01 ENCOUNTER — Ambulatory Visit (AMBULATORY_SURGERY_CENTER): Payer: 59 | Admitting: Gastroenterology

## 2018-01-01 ENCOUNTER — Encounter: Payer: Self-pay | Admitting: Gastroenterology

## 2018-01-01 VITALS — BP 120/73 | HR 67 | Temp 96.2°F | Resp 14 | Ht 65.0 in | Wt 159.0 lb

## 2018-01-01 DIAGNOSIS — K635 Polyp of colon: Secondary | ICD-10-CM

## 2018-01-01 DIAGNOSIS — D123 Benign neoplasm of transverse colon: Secondary | ICD-10-CM | POA: Diagnosis not present

## 2018-01-01 DIAGNOSIS — Z86010 Personal history of colonic polyps: Secondary | ICD-10-CM

## 2018-01-01 MED ORDER — SODIUM CHLORIDE 0.9 % IV SOLN: 500.0000 mL | Freq: Once | INTRAVENOUS | Status: DC

## 2018-01-01 NOTE — Progress Notes (Signed)
Pt's states no medical or surgical changes since previsit or office visit. 

## 2018-01-01 NOTE — Patient Instructions (Signed)
Handouts provided:  Polyps  YOU HAD AN ENDOSCOPIC PROCEDURE TODAY AT THE [facility]:   Refer to the procedure report that was given to you for any specific questions about what was found during the examination.  If the procedure report does not answer your questions, please call your gastroenterologist to clarify.  If you requested that your care partner not be given the details of your procedure findings, then the procedure report has been included in a sealed envelope for you to review at your convenience later.  YOU SHOULD EXPECT: Some feelings of bloating in the abdomen. Passage of more gas than usual.  Walking can help get rid of the air that was put into your GI tract during the procedure and reduce the bloating. If you had a lower endoscopy (such as a colonoscopy or flexible sigmoidoscopy) you may notice spotting of blood in your stool or on the toilet paper. If you underwent a bowel prep for your procedure, you may not have a normal bowel movement for a few days.  Please Note:  You might notice some irritation and congestion in your nose or some drainage.  This is from the oxygen used during your procedure.  There is no need for concern and it should clear up in a day or so.  SYMPTOMS TO REPORT IMMEDIATELY:   Following lower endoscopy (colonoscopy or flexible sigmoidoscopy):  Excessive amounts of blood in the stool  Significant tenderness or worsening of abdominal pains  Swelling of the abdomen that is new, acute  Fever of 100F or higher  For urgent or emergent issues, a gastroenterologist can be reached at any hour by calling [phone].   DIET:  We do recommend a small meal at first, but then you may proceed to your regular diet.  Drink plenty of fluids but you should avoid alcoholic beverages for 24 hours.  ACTIVITY:  You should plan to take it easy for the rest of today and you should NOT DRIVE or use heavy machinery until tomorrow (because of the sedation  medicines used during the test).    FOLLOW UP: Our staff will call the number listed on your records the next business day following your procedure to check on you and address any questions or concerns that you may have regarding the information given to you following your procedure. If we do not reach you, we will leave a message.  However, if you are feeling well and you are not experiencing any problems, there is no need to return our call.  We will assume that you have returned to your regular daily activities without incident.  If any biopsies were taken you will be contacted by phone or by letter within the next 1-3 weeks.  Please call us at [phone] if you have not heard about the biopsies in 3 weeks.    SIGNATURES/CONFIDENTIALITY: You and/or your care partner have signed paperwork which will be entered into your electronic medical record.  These signatures attest to the fact that that the information above on your After Visit Summary has been reviewed and is understood.  Full responsibility of the confidentiality of this discharge information lies with you and/or your care-partner.  

## 2018-01-01 NOTE — Op Note (Signed)
[place] Patient Name: Stacey Bean Procedure Date: 01/01/2018 7:58 AM MRN: [id number] Endoscopist: Apison. Loletha Carrow , MD Age: 57 Referring MD:  Date of Birth: 1960-03-23 Gender: Female Account #: [id number] Procedure:                Colonoscopy Indications:              Surveillance: Personal history of adenomatous                            polyps on last colonoscopy 5 years ago (TA < 56mm                            11/2012) Medicines:                Monitored Anesthesia Care Procedure:                Pre-Anesthesia Assessment:                           - Prior to the procedure, a History and Physical                            was performed, and patient medications and                            allergies were reviewed. The patient's tolerance of                            previous anesthesia was also reviewed. The risks                            and benefits of the procedure and the sedation                            options and risks were discussed with the patient.                            All questions were answered, and informed consent                            was obtained. Prior Anticoagulants: The patient has                            taken no previous anticoagulant or antiplatelet                            agents. ASA Grade Assessment: II - A patient with                            mild systemic disease. After reviewing the risks                            and benefits, the patient was deemed in  satisfactory condition to undergo the procedure.                           After obtaining informed consent, the colonoscope                            was passed under direct vision. Throughout the                            procedure, the patient's blood pressure, pulse, and                            oxygen saturations were monitored continuously. The                            Colonoscope was introduced through the anus and                      advanced to the the cecum, identified by                            appendiceal orifice and ileocecal valve. The                            colonoscopy was somewhat difficult due to                            significant looping. Successful completion of the                            procedure was aided by changing the patient to a                            supine position and using manual pressure. The                            patient tolerated the procedure well. The quality                            of the bowel preparation was excellent. The                            ileocecal valve, appendiceal orifice, and rectum                            were photographed. The quality of the bowel                            preparation was evaluated using the [facility]                            Bowel Preparation Scale) with scores of: Right  Colon = 3, Transverse Colon = 3 and Left Colon = 3                            (entire mucosa seen well with no residual staining,                            small fragments of stool or opaque liquid). The                            total BBPS score equals 9. The bowel preparation                            used was Plenvu. Scope In: 8:17:01 AM Scope Out: 8:43:42 AM Scope Withdrawal Time: 0 hours 13 minutes 53 seconds  Total Procedure Duration: 0 hours 26 minutes 41 seconds  Findings:                 The perianal and digital rectal examinations were                            normal.                           A 5 mm polyp was found in the transverse colon. The                            polyp was sessile. The polyp was removed with a                            cold snare. Resection and retrieval were complete.                           The exam was otherwise without abnormality on                            direct and retroflexion views. Complications:            No immediate complications. Estimated Blood  Loss:     Estimated blood loss was minimal. Impression:               - One 5 mm polyp in the transverse colon, removed                            with a cold snare. Resected and retrieved.                           - The examination was otherwise normal on direct                            and retroflexion views. Recommendation:           - Patient has a contact number available for                            emergencies. The signs and symptoms  of potential                            delayed complications were discussed with the                            patient. Return to normal activities tomorrow.                            Written discharge instructions were provided to the                            patient.                           - Resume previous diet.                           - Continue present medications.                           - Await pathology results.                           - Repeat colonoscopy is recommended for                            surveillance. The colonoscopy date will be                            determined after pathology results from today's                            exam become available for review. Stacey Fatima L. Loletha Carrow, MD 01/01/2018 8:49:16 AM This report has been signed electronically.

## 2018-01-01 NOTE — Progress Notes (Signed)
Called to room to assist during endoscopic procedure.  Patient ID and intended procedure confirmed with present staff. Received instructions for my participation in the procedure from the performing physician.  

## 2018-01-01 NOTE — Progress Notes (Signed)
Report given to PACU, vss 

## 2018-01-02 ENCOUNTER — Telehealth: Payer: Self-pay | Admitting: *Deleted

## 2018-01-02 NOTE — Telephone Encounter (Signed)
  Follow up Call-  Call back number 01/01/2018  Post procedure Call Back phone  # [phone]  Permission to leave phone message Yes  Some recent data might be hidden     Patient questions:  Do you have a fever, pain , or abdominal swelling? No. Pain Score  0 *  Have you tolerated food without any problems? Yes.    Have you been able to return to your normal activities? Yes.    Do you have any questions about your discharge instructions: Diet   No. Medications  No. Follow up visit  No.  Do you have questions or concerns about your Care? No.  Actions: * If pain score is 4 or above: No action needed, pain <4.

## 2018-01-04 ENCOUNTER — Encounter: Payer: Self-pay | Admitting: Gastroenterology

## 2018-01-04 ENCOUNTER — Other Ambulatory Visit: Payer: Self-pay | Admitting: Family Medicine

## 2018-01-07 ENCOUNTER — Telehealth: Payer: Self-pay

## 2018-01-07 ENCOUNTER — Encounter: Payer: Self-pay | Admitting: Family Medicine

## 2018-01-07 MED ORDER — LISINOPRIL-HYDROCHLOROTHIAZIDE 10-12.5 MG PO TABS: 1.0000 | ORAL_TABLET | Freq: Every morning | ORAL | 3 refills | Status: DC

## 2018-01-07 NOTE — Telephone Encounter (Signed)
Left message on vm per dpr and sent MyChart message stating it looks like Dr. Darnell Level printed a rx for the lisinopril-HCTZ 10-12.5 mg tab on 11/21/17 with 1 yr of refills. [See TE, 01/07/18].

## 2018-01-07 NOTE — Addendum Note (Signed)
Addended by: Brenton Grills on: 56/81/2751 03:45 PM   Modules accepted: Orders

## 2018-01-07 NOTE — Telephone Encounter (Signed)
Left message on vm per dpr and sent MyChart message stating it looks like Dr. Darnell Level printed a rx for the lisinopril-HCTZ 10-12.5 mg tab on 11/21/17 with 1 yr of refills. [See Pt Msg, 01/07/18].  Asked pt to let us know if she was able to take rx to the pharmacy.

## 2018-01-07 NOTE — Telephone Encounter (Signed)
Spoke with pt about printed rx.  States she did not get rx in her hand at end of visit.  I e-scribed refill.  Pt aware.

## 2018-01-18 DIAGNOSIS — Z23 Encounter for immunization: Secondary | ICD-10-CM | POA: Diagnosis not present

## 2018-01-23 ENCOUNTER — Encounter: Payer: Self-pay | Admitting: Family Medicine

## 2018-02-17 ENCOUNTER — Encounter: Payer: Self-pay | Admitting: Family Medicine

## 2018-07-01 ENCOUNTER — Other Ambulatory Visit: Payer: Self-pay | Admitting: Gynecology

## 2018-07-01 DIAGNOSIS — Z1231 Encounter for screening mammogram for malignant neoplasm of breast: Secondary | ICD-10-CM

## 2018-07-24 ENCOUNTER — Other Ambulatory Visit: Payer: Self-pay | Admitting: Physician Assistant

## 2018-08-21 ENCOUNTER — Ambulatory Visit
Admission: RE | Admit: 2018-08-21 | Discharge: 2018-08-21 | Disposition: A | Discharge status: Home / Self Care | Payer: 59 | Source: Ambulatory Visit | Attending: Gynecology | Admitting: Gynecology

## 2018-08-21 ENCOUNTER — Other Ambulatory Visit: Payer: Self-pay

## 2018-08-21 DIAGNOSIS — Z1231 Encounter for screening mammogram for malignant neoplasm of breast: Secondary | ICD-10-CM

## 2018-11-19 ENCOUNTER — Encounter: Payer: Self-pay | Admitting: Gynecology

## 2018-11-20 ENCOUNTER — Telehealth: Payer: Self-pay | Admitting: Family Medicine

## 2018-11-20 NOTE — Telephone Encounter (Signed)
Patient called back to reschedule physical appt due to it being schedule @ 5:30. We scheduled her in November. She stated that this is more of a medication follow up than an actual physical  Patient wanted to know if you wanted her to do labs sooner to see how everything is going to continue her medication or wait until November. Patient stated that she is still doing fine on the medications prescribed

## 2018-11-20 NOTE — Telephone Encounter (Signed)
Ok to wait until Nov for labs. I'm glad she's doing well on BP med.

## 2018-11-25 ENCOUNTER — Other Ambulatory Visit: Payer: 59

## 2018-11-25 ENCOUNTER — Other Ambulatory Visit: Payer: Self-pay | Admitting: Family Medicine

## 2018-11-25 DIAGNOSIS — I10 Essential (primary) hypertension: Secondary | ICD-10-CM

## 2018-11-27 ENCOUNTER — Encounter: Payer: 59 | Admitting: Family Medicine

## 2018-12-30 ENCOUNTER — Telehealth: Payer: Self-pay

## 2018-12-30 NOTE — Telephone Encounter (Signed)
LVM to call clinic, pt needs COVID screen, front door and back lab info 11.9.2020 TLJ

## 2019-01-02 ENCOUNTER — Other Ambulatory Visit (INDEPENDENT_AMBULATORY_CARE_PROVIDER_SITE_OTHER): Payer: 59

## 2019-01-02 ENCOUNTER — Other Ambulatory Visit: Payer: Self-pay

## 2019-01-02 DIAGNOSIS — I10 Essential (primary) hypertension: Secondary | ICD-10-CM

## 2019-01-02 LAB — LIPID PANEL
Cholesterol: 168 mg/dL (ref 0–200)
HDL: 57.7 mg/dL (ref >39.00)
LDL Cholesterol: 99 mg/dL (ref 0–99)
NonHDL: 109.97
Total CHOL/HDL Ratio: 3
Triglycerides: 53 mg/dL (ref 0.0–149.0)
VLDL: 10.6 mg/dL (ref 0.0–40.0)

## 2019-01-02 LAB — BASIC METABOLIC PANEL
BUN: 12 mg/dL (ref 6–23)
CO2: 29 mEq/L (ref 19–32)
Calcium: 9 mg/dL (ref 8.4–10.5)
Chloride: 104 mEq/L (ref 96–112)
Creatinine, Ser: 0.8 mg/dL (ref 0.40–1.20)
GFR: 74.12 mL/min (ref >60.00)
Glucose, Bld: 86 mg/dL (ref 70–99)
Potassium: 4 mEq/L (ref 3.5–5.1)
Sodium: 140 mEq/L (ref 135–145)

## 2019-01-06 ENCOUNTER — Ambulatory Visit (INDEPENDENT_AMBULATORY_CARE_PROVIDER_SITE_OTHER): Payer: 59 | Admitting: Family Medicine

## 2019-01-06 ENCOUNTER — Other Ambulatory Visit: Payer: Self-pay

## 2019-01-06 ENCOUNTER — Encounter: Payer: Self-pay | Admitting: Family Medicine

## 2019-01-06 VITALS — BP 126/80 | HR 56 | Temp 98.3°F | Ht 65.5 in | Wt 154.2 lb

## 2019-01-06 DIAGNOSIS — I10 Essential (primary) hypertension: Secondary | ICD-10-CM | POA: Diagnosis not present

## 2019-01-06 DIAGNOSIS — Z0000 Encounter for general adult medical examination without abnormal findings: Secondary | ICD-10-CM

## 2019-01-06 DIAGNOSIS — Z85828 Personal history of other malignant neoplasm of skin: Secondary | ICD-10-CM | POA: Diagnosis not present

## 2019-01-06 MED ORDER — LISINOPRIL-HYDROCHLOROTHIAZIDE 10-12.5 MG PO TABS: 1.0000 | ORAL_TABLET | Freq: Every morning | ORAL | 3 refills | Status: DC

## 2019-01-06 NOTE — Assessment & Plan Note (Signed)
Preventative protocols reviewed and updated unless pt declined. Discussed healthy diet and lifestyle.  

## 2019-01-06 NOTE — Progress Notes (Signed)
This visit was conducted in person.  BP 126/80 (BP Location: Left Arm, Patient Position: Sitting, Cuff Size: Normal)   Pulse (!) 56   Temp 98.3 F (36.8 C) (Temporal)   Ht 5' 5.5" (1.664 m)   Wt 154 lb 3 oz (69.9 kg)   SpO2 98%   BMI 25.27 kg/m    CC: CPE Subjective:    Patient ID: Stacey Bean, female    DOB: May 27, 1960, 58 y.o.   MRN: [id number]  HPI: Stacey Bean is a 58 y.o. female presenting on 01/06/2019 for No chief complaint on file.   Last year we tried plain lisinopril in place of combo with HCTZ - BP started trending up so now back on combo pill.   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   Preventative: COLONOSCOPY Date: 11/2012 tubular adenoma, mod diverticulosis, int hem, rpt 5 yrs Deatra Ina) COLONOSCOPY 12/2017 - HP, rpt 10 yrs (Danis) Mammogram - 08/2018 [facility] Well woman - OBGYN Dr. Phineas Real in the spring - every 3 years VAGINAL HYSTERECTOMY Date: 2004 heavy bleeding, ovaries remain Flu shotyearly at work Td 2009, Tdap 2018 Shingrix - completed 2019  Seat belt use discussed.  Sunscreen use discussed. Sees derm - h/o 4 BCC.  Non smoker  Alcohol - a few glasses of wine a week  Dentist Q6 mo Eye exam yearly  Caffeine: 2 cups coffee Widowed, husband died at age 89 of colon cancer. No pets.  2 grown daughters, 1 granddaughter all in [place] Occ: works for [employer] department Activity: walks regularly 4d/wk 2.5 mi  Diet: good water, daily fruits/vegetables, doesn't tolerate milk well     Relevant past medical, surgical, family and social history reviewed and updated as indicated. Interim medical history since our last visit reviewed. Allergies and medications reviewed and updated. Outpatient Medications Prior to Visit  Medication Sig Dispense Refill  . lisinopril-hydrochlorothiazide (PRINZIDE,ZESTORETIC) 10-12.5 MG tablet Take 1 tablet by mouth every morning. 90 tablet 3   No facility-administered medications prior to visit.      Per HPI  unless specifically indicated in ROS section below Review of Systems  Constitutional: Negative for activity change, appetite change, chills, fatigue, fever and unexpected weight change.  HENT: Negative for hearing loss.   Eyes: Negative for visual disturbance.  Respiratory: Negative for cough, chest tightness, shortness of breath and wheezing.   Cardiovascular: Negative for chest pain, palpitations and leg swelling.  Gastrointestinal: Negative for abdominal distention, abdominal pain, blood in stool, constipation, diarrhea, nausea and vomiting.  Genitourinary: Negative for difficulty urinating and hematuria.  Musculoskeletal: Negative for arthralgias, myalgias and neck pain.  Skin: Negative for rash.  Neurological: Negative for dizziness, seizures, syncope and headaches.  Hematological: Negative for adenopathy. Does not bruise/bleed easily.  Psychiatric/Behavioral: Negative for dysphoric mood. The patient is not nervous/anxious.    Objective:    BP 126/80 (BP Location: Left Arm, Patient Position: Sitting, Cuff Size: Normal)   Pulse (!) 56   Temp 98.3 F (36.8 C) (Temporal)   Ht 5' 5.5" (1.664 m)   Wt 154 lb 3 oz (69.9 kg)   SpO2 98%   BMI 25.27 kg/m   Wt Readings from Last 3 Encounters:  01/06/19 154 lb 3 oz (69.9 kg)  01/01/18 159 lb (72.1 kg)  12/18/17 160 lb (72.6 kg)    Physical Exam Vitals signs and nursing note reviewed.  Constitutional:      General: She is not in acute distress.    Appearance: Normal appearance.  She is well-developed. She is not ill-appearing.  HENT:     Head: Normocephalic and atraumatic.     Right Ear: Hearing, tympanic membrane, ear canal and external ear normal.     Left Ear: Hearing, tympanic membrane, ear canal and external ear normal.     Nose: Nose normal.     Mouth/Throat:     Mouth: Mucous membranes are moist.     Pharynx: Oropharynx is clear. Uvula midline. No posterior oropharyngeal erythema.  Eyes:     General: No scleral icterus.     Conjunctiva/sclera: Conjunctivae normal.     Pupils: Pupils are equal, round, and reactive to light.  Neck:     Musculoskeletal: Normal range of motion and neck supple.  Cardiovascular:     Rate and Rhythm: Normal rate and regular rhythm.     Pulses: Normal pulses.          Radial pulses are 2+ on the right side and 2+ on the left side.     Heart sounds: Normal heart sounds. No murmur.  Pulmonary:     Effort: Pulmonary effort is normal. No respiratory distress.     Breath sounds: Normal breath sounds. No wheezing, rhonchi or rales.  Abdominal:     General: Abdomen is flat. Bowel sounds are normal. There is no distension.     Palpations: Abdomen is soft. There is no mass.     Tenderness: There is no abdominal tenderness. There is no guarding or rebound.     Hernia: No hernia is present.  Musculoskeletal: Normal range of motion.     Right lower leg: No edema.     Left lower leg: No edema.  Lymphadenopathy:     Cervical: No cervical adenopathy.  Skin:    General: Skin is warm and dry.     Findings: No rash.  Neurological:     General: No focal deficit present.     Mental Status: She is alert and oriented to person, place, and time.     Comments: CN grossly intact, station and gait intact  Psychiatric:        Mood and Affect: Mood normal.        Behavior: Behavior normal.        Thought Content: Thought content normal.        Judgment: Judgment normal.       Results for orders placed or performed in visit on [id number]  Basic metabolic panel  Result Value Ref Range   Sodium 140 135 - 145 mEq/L   Potassium 4.0 3.5 - 5.1 mEq/L   Chloride 104 96 - 112 mEq/L   CO2 29 19 - 32 mEq/L   Glucose, Bld 86 70 - 99 mg/dL   BUN 12 6 - 23 mg/dL   Creatinine, Ser 0.80 0.40 - 1.20 mg/dL   GFR 74.12 >60.00 mL/min   Calcium 9.0 8.4 - 10.5 mg/dL  Lipid panel  Result Value Ref Range   Cholesterol 168 0 - 200 mg/dL   Triglycerides 53.0 0.0 - 149.0 mg/dL   HDL 57.70 >39.00 mg/dL   VLDL 10.6  0.0 - 40.0 mg/dL   LDL Cholesterol 99 0 - 99 mg/dL   Total CHOL/HDL Ratio 3    NonHDL 109.97    Assessment & Plan:   Problem List Items Addressed This Visit    History of basal cell cancer    Sees derm yearly.       Health maintenance examination - Primary    Preventative  protocols reviewed and updated unless pt declined. Discussed healthy diet and lifestyle.       Essential hypertension    Chronic, stable. Continue current regimen.       Relevant Medications   lisinopril-hydrochlorothiazide (ZESTORETIC) 10-12.5 MG tablet       Meds ordered this encounter  Medications  . lisinopril-hydrochlorothiazide (ZESTORETIC) 10-12.5 MG tablet    Sig: Take 1 tablet by mouth every morning.    Dispense:  90 tablet    Refill:  3   No orders of the defined types were placed in this encounter.   Follow up plan: Return in about 1 year (around 01/06/2020) for annual exam, prior fasting for blood work.  Ria Bush, MD

## 2019-01-06 NOTE — Patient Instructions (Signed)
You are doing well today Return as needed or in 1 year for next physical Continue blood pressure medicine.  Health Maintenance for Postmenopausal Women Menopause is a normal process in which your ability to get pregnant comes to an end. This process happens slowly over many months or years, usually between the ages of 22 and 67. Menopause is complete when you have missed your menstrual periods for 12 months. It is important to talk with your health care provider about some of the most common conditions that affect women after menopause (postmenopausal women). These include heart disease, cancer, and bone loss (osteoporosis). Adopting a healthy lifestyle and getting preventive care can help to promote your health and wellness. The actions you take can also lower your chances of developing some of these common conditions. What should I know about menopause? During menopause, you may get a number of symptoms, such as:  Hot flashes. These can be moderate or severe.  Night sweats.  Decrease in sex drive.  Mood swings.  Headaches.  Tiredness.  Irritability.  Memory problems.  Insomnia. Choosing to treat or not to treat these symptoms is a decision that you make with your health care provider. Do I need hormone replacement therapy?  Hormone replacement therapy is effective in treating symptoms that are caused by menopause, such as hot flashes and night sweats.  Hormone replacement carries certain risks, especially as you become older. If you are thinking about using estrogen or estrogen with progestin, discuss the benefits and risks with your health care provider. What is my risk for heart disease and stroke? The risk of heart disease, heart attack, and stroke increases as you age. One of the causes may be a change in the body's hormones during menopause. This can affect how your body uses dietary fats, triglycerides, and cholesterol. Heart attack and stroke are medical emergencies. There  are many things that you can do to help prevent heart disease and stroke. Watch your blood pressure  High blood pressure causes heart disease and increases the risk of stroke. This is more likely to develop in people who have high blood pressure readings, are of African descent, or are overweight.  Have your blood pressure checked: ? Every 3-5 years if you are 12-66 years of age. ? Every year if you are 38 years old or older. Eat a healthy diet   Eat a diet that includes plenty of vegetables, fruits, low-fat dairy products, and lean protein.  Do not eat a lot of foods that are high in solid fats, added sugars, or sodium. Get regular exercise Get regular exercise. This is one of the most important things you can do for your health. Most adults should:  Try to exercise for at least 150 minutes each week. The exercise should increase your heart rate and make you sweat (moderate-intensity exercise).  Try to do strengthening exercises at least twice each week. Do these in addition to the moderate-intensity exercise.  Spend less time sitting. Even light physical activity can be beneficial. Other tips  Work with your health care provider to achieve or maintain a healthy weight.  Do not use any products that contain nicotine or tobacco, such as cigarettes, e-cigarettes, and chewing tobacco. If you need help quitting, ask your health care provider.  Know your numbers. Ask your health care provider to check your cholesterol and your blood sugar (glucose). Continue to have your blood tested as directed by your health care provider. Do I need screening for cancer? Depending on  your health history and family history, you may need to have cancer screening at different stages of your life. This may include screening for:  Breast cancer.  Cervical cancer.  Lung cancer.  Colorectal cancer. What is my risk for osteoporosis? After menopause, you may be at increased risk for osteoporosis.  Osteoporosis is a condition in which bone destruction happens more quickly than new bone creation. To help prevent osteoporosis or the bone fractures that can happen because of osteoporosis, you may take the following actions:  If you are 60-45 years old, get at least 1,000 mg of calcium and at least 600 mg of vitamin D per day.  If you are older than age 37 but younger than age 39, get at least 1,200 mg of calcium and at least 600 mg of vitamin D per day.  If you are older than age 75, get at least 1,200 mg of calcium and at least 800 mg of vitamin D per day. Smoking and drinking excessive alcohol increase the risk of osteoporosis. Eat foods that are rich in calcium and vitamin D, and do weight-bearing exercises several times each week as directed by your health care provider. How does menopause affect my mental health? Depression may occur at any age, but it is more common as you become older. Common symptoms of depression include:  Low or sad mood.  Changes in sleep patterns.  Changes in appetite or eating patterns.  Feeling an overall lack of motivation or enjoyment of activities that you previously enjoyed.  Frequent crying spells. Talk with your health care provider if you think that you are experiencing depression. General instructions See your health care provider for regular wellness exams and vaccines. This may include:  Scheduling regular health, dental, and eye exams.  Getting and maintaining your vaccines. These include: ? Influenza vaccine. Get this vaccine each year before the flu season begins. ? Pneumonia vaccine. ? Shingles vaccine. ? Tetanus, diphtheria, and pertussis (Tdap) booster vaccine. Your health care provider may also recommend other immunizations. Tell your health care provider if you have ever been abused or do not feel safe at home. Summary  Menopause is a normal process in which your ability to get pregnant comes to an end.  This condition causes  hot flashes, night sweats, decreased interest in sex, mood swings, headaches, or lack of sleep.  Treatment for this condition may include hormone replacement therapy.  Take actions to keep yourself healthy, including exercising regularly, eating a healthy diet, watching your weight, and checking your blood pressure and blood sugar levels.  Get screened for cancer and depression. Make sure that you are up to date with all your vaccines. This information is not intended to replace advice given to you by your health care provider. Make sure you discuss any questions you have with your health care provider. Document Released: 03/31/2005 Document Revised: 01/30/2018 Document Reviewed: 01/30/2018 Elsevier Patient Education  2020 [employer].

## 2019-01-06 NOTE — Assessment & Plan Note (Signed)
Sees derm yearly

## 2019-01-06 NOTE — Assessment & Plan Note (Signed)
Chronic, stable. Continue current regimen. 

## 2019-03-26 ENCOUNTER — Encounter: Payer: Self-pay | Admitting: Family Medicine

## 2019-03-26 ENCOUNTER — Ambulatory Visit (INDEPENDENT_AMBULATORY_CARE_PROVIDER_SITE_OTHER): Payer: 59 | Admitting: Family Medicine

## 2019-03-26 ENCOUNTER — Telehealth: Payer: Self-pay

## 2019-03-26 ENCOUNTER — Other Ambulatory Visit: Payer: Self-pay

## 2019-03-26 VITALS — Temp 98.6°F | Wt 159.0 lb

## 2019-03-26 DIAGNOSIS — R220 Localized swelling, mass and lump, head: Secondary | ICD-10-CM

## 2019-03-26 MED ORDER — SULFAMETHOXAZOLE-TRIMETHOPRIM 800-160 MG PO TABS: 1.0000 | ORAL_TABLET | Freq: Two times a day (BID) | ORAL | 0 refills | Status: AC

## 2019-03-26 NOTE — Progress Notes (Addendum)
I connected with [name] on 03/26/19 at 10:00 AM EST by video and verified that I am speaking with the correct person using two identifiers.   I discussed the limitations, risks, security and privacy concerns of performing an evaluation and management service by video and the availability of in person appointments. I also discussed with the patient that there may be a patient responsible charge related to this service. The patient expressed understanding and agreed to proceed.  Patient location: Home Provider Location: [facility] Participants: Lesleigh Noe and [name]   Subjective:     Stacey Bean is a 59 y.o. female presenting for Eye Problem (swelling under right eye and nasal area, tender to the touch. started about 1 week ago. No fever.)     HPI   #Eye swelling - no cold symptoms - has noticed some swelling and tenderness - towards the nasal bridge and under the eye - has noticed some blotchy redness in the morning - no fever or chills - wears contacts when working - had some difficulty putting contacts in the morning but eventually got them in -- eyes felt closed - very tender to the touch  - this is where her glasses rest when she wears   Review of Systems  Constitutional: Negative for chills and fever.  HENT: Positive for rhinorrhea. Negative for congestion and sore throat.        Dry sinuses  Eyes: Negative for pain, discharge, redness, itching and visual disturbance.  Gastrointestinal: Negative for diarrhea, nausea and vomiting.     Social History   Tobacco Use  Smoking Status Former Smoker  . Types: Cigarettes  . Quit date: 02/20/1982  . Years since quitting: 37.1  Smokeless Tobacco Never Used        Objective:   BP Readings from Last 3 Encounters:  01/06/19 126/80  01/01/18 120/73  11/21/17 120/82   Wt Readings from Last 3 Encounters:  03/26/19 159 lb (72.1 kg)  01/06/19 154 lb 3 oz (69.9 kg)  01/01/18 159 lb (72.1  kg)   Temp 98.6 F (37 C) Comment: per patient  Wt 159 lb (72.1 kg) Comment: per patient  BMI 26.06 kg/m    Physical Exam Constitutional:      Appearance: Normal appearance. She is not ill-appearing.  HENT:     Head: Normocephalic and atraumatic.     Right Ear: External ear normal.     Left Ear: External ear normal.     Nose:     Comments: Slight erythema and swelling over the right upper nose Eyes:     General: No scleral icterus.       Right eye: No discharge.        Left eye: No discharge.     Extraocular Movements: Extraocular movements intact.     Conjunctiva/sclera: Conjunctivae normal.  Pulmonary:     Effort: Pulmonary effort is normal. No respiratory distress.  Neurological:     Mental Status: She is alert. Mental status is at baseline.  Psychiatric:        Mood and Affect: Mood normal.        Behavior: Behavior normal.        Thought Content: Thought content normal.        Judgment: Judgment normal.             Assessment & Plan:   Problem List Items Addressed This Visit    None    Visit  Diagnoses    Swelling of nose    -  Primary   Relevant Medications   sulfamethoxazole-trimethoprim (BACTRIM DS) 800-160 MG tablet     Pt notes dry sinus passages. Mild erythema to the nose, but does not seem consistent with cellulitis at this time. Advised trial of saline nose spray for dry sinuses. If new/worsening symptoms - fever/chills, spreading redness, worsening pain -- call back. If not improved could do course of antibiotics.   Elected to provide wait and see antibiotics for patient. Will send in prescription now if patient not better in a few days.   Return if symptoms worsen or fail to improve.  Lesleigh Noe, MD

## 2019-03-26 NOTE — Addendum Note (Signed)
Addended by: Waunita Schooner R on: 03/26/2019 11:59 AM   Modules accepted: Orders

## 2019-03-26 NOTE — Telephone Encounter (Signed)
Pt called asking for a referral to a ENT for a URI. I advised her that she would need to have a VV first. Dr Darnell Level did not have anything so I put her with Dr Einar Pheasant.

## 2019-04-24 ENCOUNTER — Encounter: Payer: Self-pay | Admitting: Family Medicine

## 2019-04-24 ENCOUNTER — Ambulatory Visit: Payer: 59 | Admitting: Family Medicine

## 2019-04-24 ENCOUNTER — Telehealth: Payer: Self-pay

## 2019-04-24 ENCOUNTER — Other Ambulatory Visit: Payer: Self-pay

## 2019-04-24 ENCOUNTER — Ambulatory Visit (INDEPENDENT_AMBULATORY_CARE_PROVIDER_SITE_OTHER)
Admission: RE | Admit: 2019-04-24 | Discharge: 2019-04-24 | Disposition: A | Discharge status: Home / Self Care | Payer: 59 | Source: Ambulatory Visit | Attending: Family Medicine | Admitting: Family Medicine

## 2019-04-24 VITALS — BP 122/82 | HR 79 | Temp 97.6°F | Ht 65.5 in | Wt 164.1 lb

## 2019-04-24 DIAGNOSIS — K5900 Constipation, unspecified: Secondary | ICD-10-CM

## 2019-04-24 DIAGNOSIS — R1030 Lower abdominal pain, unspecified: Secondary | ICD-10-CM

## 2019-04-24 LAB — CBC WITH DIFFERENTIAL/PLATELET
Basophils Absolute: 0 10*3/uL (ref 0.0–0.1)
Basophils Relative: 0.3 % (ref 0.0–3.0)
Eosinophils Absolute: 0.2 10*3/uL (ref 0.0–0.7)
Eosinophils Relative: 2.5 % (ref 0.0–5.0)
HCT: 41 % (ref 36.0–46.0)
Hemoglobin: 13.9 g/dL (ref 12.0–15.0)
Lymphocytes Relative: 22.3 % (ref 12.0–46.0)
Lymphs Abs: 1.6 10*3/uL (ref 0.7–4.0)
MCHC: 33.8 g/dL (ref 30.0–36.0)
MCV: 87.2 fl (ref 78.0–100.0)
Monocytes Absolute: 0.6 10*3/uL (ref 0.1–1.0)
Monocytes Relative: 8 % (ref 3.0–12.0)
Neutro Abs: 4.7 10*3/uL (ref 1.4–7.7)
Neutrophils Relative %: 66.9 % (ref 43.0–77.0)
Platelets: 350 10*3/uL (ref 150.0–400.0)
RBC: 4.71 Mil/uL (ref 3.87–5.11)
RDW: 12.9 % (ref 11.5–15.5)
WBC: 7.1 10*3/uL (ref 4.0–10.5)

## 2019-04-24 LAB — COMPREHENSIVE METABOLIC PANEL
ALT: 8 U/L (ref 0–35)
AST: 15 U/L (ref 0–37)
Albumin: 4.1 g/dL (ref 3.5–5.2)
Alkaline Phosphatase: 63 U/L (ref 39–117)
BUN: 12 mg/dL (ref 6–23)
CO2: 31 mEq/L (ref 19–32)
Calcium: 9.6 mg/dL (ref 8.4–10.5)
Chloride: 102 mEq/L (ref 96–112)
Creatinine, Ser: 0.73 mg/dL (ref 0.40–1.20)
GFR: 82.29 mL/min (ref >60.00)
Glucose, Bld: 95 mg/dL (ref 70–99)
Potassium: 3.7 mEq/L (ref 3.5–5.1)
Sodium: 139 mEq/L (ref 135–145)
Total Bilirubin: 0.7 mg/dL (ref 0.2–1.2)
Total Protein: 7.1 g/dL (ref 6.0–8.3)

## 2019-04-24 LAB — LIPASE: Lipase: 9 U/L — ABNORMAL LOW (ref 11.0–59.0)

## 2019-04-24 NOTE — Assessment & Plan Note (Addendum)
New severe abdominal cramping last night that is some better today but with persistent lower abd discomfort and guarding on exam. Check labs and xray today. Xray without air fluid levels with high stool burden. Await radiology eval. Will increase bowel regimen as per below.

## 2019-04-24 NOTE — Telephone Encounter (Signed)
Pt already has appt 04/24/19 at 12:30 with Dr Darnell Level.

## 2019-04-24 NOTE — Assessment & Plan Note (Addendum)
Relatively new onset constipation not improving with dulcolax + miralax then last night with bad cramping.  Xrays today without signs of obstruction.  Will increase water intake.  Will try milk of magnesia.  If ineffective, will increase miralax to 2 capfuls daily.  Hold dulcolax for now.  Will send home with iFOB for tarry stool. UTD colon cancer screening.

## 2019-04-24 NOTE — Progress Notes (Signed)
This visit was conducted in person.  BP 122/82 (BP Location: Left Arm, Patient Position: Sitting, Cuff Size: Normal)   Pulse 79   Temp 97.6 F (36.4 C) (Temporal)   Ht 5' 5.5" (1.664 m)   Wt 164 lb 2 oz (74.4 kg)   SpO2 98%   BMI 26.90 kg/m    CC: abd pain Subjective:    Patient ID: Stacey Bean, female    DOB: May 05, 1960, 59 y.o.   MRN: [id number]  HPI: Stacey Bean is a 59 y.o. female presenting on 04/24/2019 for Abdominal Pain (C/o abd cramping and constipation. Denies N/V/D. Pain started last night.  Tried laxatives. )   Several week h/o increased constipation, bloated feeling. Last night at 10pm had severe cramping all night - almost went to ER due to this. Describes periumbilical cramping that traveled to [facility]. Some nausea last night as well.   Managing with dulcolax OTC and 1 capful miralax daily without benefit for last several days.   Last normal BM was a few weeks ago.  Since then more tarry and sticky stools.  No fevers/chills, vomiting, passing some gas. No blood in stool. No urinary symptoms. Very small amt bowel movements. Appetite ok.   She has trouble tolerating fiber supplements in general.  No recent diet changes.  Eats 1 greek yogurt a day.   Did strain a lot a few weeks ago involving heavy lifting - icing back.   She did take 5d bactrim antibiotic course early last month.   COLONOSCOPY 12/2017 - [facility], rpt 10 yrs ([facility]) No h/o diverticulitis/osis.      Relevant past medical, surgical, family and social history reviewed and updated as indicated. Interim medical history since our last visit reviewed. Allergies and medications reviewed and updated. Outpatient Medications Prior to Visit  Medication Sig Dispense Refill  . lisinopril-hydrochlorothiazide (ZESTORETIC) 10-12.5 MG tablet Take 1 tablet by mouth every morning. 90 tablet 3   No facility-administered medications prior to visit.     Per HPI unless specifically indicated in ROS section  below Review of Systems Objective:    BP 122/82 (BP Location: Left Arm, Patient Position: Sitting, Cuff Size: Normal)   Pulse 79   Temp 97.6 F (36.4 C) (Temporal)   Ht 5' 5.5" (1.664 m)   Wt 164 lb 2 oz (74.4 kg)   SpO2 98%   BMI 26.90 kg/m   Wt Readings from Last 3 Encounters:  04/24/19 164 lb 2 oz (74.4 kg)  03/26/19 159 lb (72.1 kg)  01/06/19 154 lb 3 oz (69.9 kg)    Physical Exam Vitals and nursing note reviewed.  Constitutional:      Appearance: Normal appearance. She is not ill-appearing.  Cardiovascular:     Rate and Rhythm: Normal rate and regular rhythm.     Pulses: Normal pulses.     Heart sounds: Normal heart sounds. No murmur.  Pulmonary:     Effort: Pulmonary effort is normal. No respiratory distress.     Breath sounds: Normal breath sounds. No wheezing, rhonchi or rales.  Abdominal:     General: Abdomen is flat. Bowel sounds are normal. There is no distension.     Palpations: Abdomen is soft. There is no mass.     Tenderness: There is abdominal tenderness (moderate) in the right upper quadrant, right lower quadrant, suprapubic area and left lower quadrant. There is guarding. There is no right CVA tenderness, left CVA tenderness or rebound. Negative signs include Murphy's sign and psoas sign.  Hernia: No hernia is present. There is no hernia in the umbilical area, ventral area, left inguinal area or right inguinal area.  Musculoskeletal:     Right lower leg: No edema.     Left lower leg: No edema.  Skin:    Findings: No rash.  Neurological:     Mental Status: She is alert.       Lab Results  Component Value Date   CREATININE 0.80 01/02/2019   BUN 12 01/02/2019   NA 140 01/02/2019   K 4.0 01/02/2019   CL 104 01/02/2019   CO2 29 01/02/2019    DG Abd 2 Views CLINICAL DATA:  Abdominal pain. Constipation.  EXAM: ABDOMEN - 2 VIEW  COMPARISON:  None.  FINDINGS: There is moderate stool in the: Colon but there is no fecal impaction or excessive  stool in the rectosigmoid region. The bowel gas pattern is normal. No free air. Surgical clips from previous cholecystectomy. No significant bone abnormality.  IMPRESSION: Benign-appearing abdomen. Moderate stool in the colon.  Electronically Signed   By: Lorriane Shire M.D.   On: 04/24/2019 13:36   Assessment & Plan:  This visit occurred during the SARS-CoV-2 public health emergency.  Safety protocols were in place, including screening questions prior to the visit, additional usage of staff PPE, and extensive cleaning of exam room while observing appropriate contact time as indicated for disinfecting solutions.   Problem List Items Addressed This Visit    Lower abdominal pain - Primary    New severe abdominal cramping last night that is some better today but with persistent lower abd discomfort and guarding on exam. Check labs and xray today. Xray without air fluid levels with high stool burden. Await radiology eval. Will increase bowel regimen as per below.       Relevant Orders   DG Abd 2 Views (Completed)   Comprehensive metabolic panel   CBC with Differential/Platelet   Lipase   Fecal occult blood, imunochemical   Constipation    Relatively new onset constipation not improving with dulcolax + miralax - will send home with iFOB.  Will increase water intake.  Will try milk of magnesia If ineffective, will increase miralax to 2 capfuls daily.  Hold dulcolax for now.       Relevant Orders   Fecal occult blood, imunochemical       No orders of the defined types were placed in this encounter.  Orders Placed This Encounter  Procedures  . Fecal occult blood, imunochemical    Standing Status:   Future    Standing Expiration Date:   04/23/2020  . DG Abd 2 Views    Standing Status:   Future    Number of Occurrences:   1    Standing Expiration Date:   06/23/2020    Order Specific Question:   Reason for Exam (SYMPTOM  OR DIAGNOSIS REQUIRED)    Answer:   constipation, abd pain     Order Specific Question:   Is patient pregnant?    Answer:   No    Order Specific Question:   Preferred imaging location?    Answer:   [facility]    Order Specific Question:   Radiology Contrast Protocol - do NOT remove file path    Answer:   [email or web address]  . Comprehensive metabolic panel  . CBC with Differential/Platelet  . Lipase    Patient Instructions  Xray today - showing signs of constipation.  Labs today  Increase bowel regimen -  start taking milk of magnesia 82mL daily. If not helping, increase miralax to 2 capful daily - with increased water.  Hold dulcolax for now.  Start probiotic daily like activia or align or philips colon health.  If abdominal pain returns, go to ER or let us know for CT scan.    Follow up plan: Return if symptoms worsen or fail to improve.  Ria Bush, MD

## 2019-04-24 NOTE — Telephone Encounter (Signed)
[facility] - Client TELEPHONE ADVICE RECORD [facility] Patient Name: Stacey Bean Gender: Female DOB: 10/03/1960 Age: 59 Y 79 M 10 D Return Phone Number: [phone] (Primary), [phone] (Secondary) Address[place] [address] Client [facility] - Client Client [facility] - Day Physician Ria Bush - MD Contact Type Call Who Is Calling Patient / Member / Family / Caregiver Call Type Triage / Clinical Relationship To Patient Self Return Phone Number [phone] (Primary) Chief Complaint ABDOMINAL PAIN - Severe and only in abdomen Reason for Call Symptomatic / Request for Health Information Initial Comment Caller states she's having severe abdominal pain. No BM for two weeks. Taken several things to try and have a BM. Translation No Nurse Assessment Nurse: Neena Rhymes, RN, Sharyn Lull Date/Time (Eastern Time): 04/24/2019 9:51:22 AM Confirm and document reason for call. If symptomatic, describe symptoms. ---Caller states she's having severe abdominal pain mid just above her umbilicus. No BM for two weeks. Taken several things to try and have a BM. Had a small soft stool 2 days ago Taking Mirolax, dulcolax. States she is bloated says she looks 6 months pregnant. Was very nauseated last night denies vomiting. Caller states just above umbilicus has a small bulge noticed it about 6 months ago. Has the patient had close contact with a person known or suspected to have the novel coronavirus illness OR traveled / lives in area with major community spread (including international travel) in the last 14 days from the onset of symptoms? * If Asymptomatic, screen for exposure and travel within the last 14 days. ---No Does the patient have any new or worsening symptoms? ---Yes Will a triage be completed? ---Yes Related visit to physician within the last 2 weeks? ---Yes Does the PT have  any chronic conditions? (i.e. diabetes, asthma, this includes High risk factors for pregnancy, etc.) ---Yes List chronic conditions. ---HBP Is this a behavioral health or substance abuse call? ---No PLEASE NOTE: All timestamps contained within this report are represented as [place] Standard Time. CONFIDENTIALTY NOTICE: This fax transmission is intended only for the addressee. It contains information that is legally privileged, confidential or otherwise protected from use or disclosure. If you are not the intended recipient, you are strictly prohibited from reviewing, disclosing, copying using or disseminating any of this information or taking any action in reliance on or regarding this information. If you have received this fax in error, please notify us immediately by telephone so that we can arrange for its return to [place]. Phone: [phone], Toll-Free: [phone], Fax: [phone] Page: 2 of 2 Call Id: [id number] Guidelines Guideline Title Affirmed Question Affirmed Notes Nurse Date/Time Eilene Ghazi Time) Abdominal Pain - Upper [1] MILD-MODERATE pain AND [2] constant AND [3] present > 2 hours Neena Rhymes, RNSharyn Lull 04/24/2019 9:55:25 AM Disp. Time Eilene Ghazi Time) Disposition Final User 04/24/2019 9:48:03 AM Send to Urgent Antony Haste 04/24/2019 10:08:00 AM See HCP within 4 Hours (or PCP triage) Yes Neena Rhymes, RN, Julio Sicks Disagree/Comply Comply Caller Understands Yes PreDisposition Call Doctor Care Advice Given Per Guideline SEE HCP WITHIN 4 HOURS (OR PCP TRIAGE): * IF OFFICE WILL BE OPEN: You need to be seen within the next 3 or 4 hours. Call your doctor (or NP/PA) now or as soon as the office opens. Comments User: Fayne Mediate, RN Date/Time Eilene Ghazi Time): 04/24/2019 10:07:26 AM Warm transferred to [facility] in office explained triage outcome is for pt to be seen within 4 hours. Referrals Warm transfer to  backline

## 2019-04-24 NOTE — Telephone Encounter (Signed)
Seen today. 

## 2019-04-24 NOTE — Patient Instructions (Addendum)
Xray today - showing signs of constipation.  Labs today  Increase bowel regimen - start taking milk of magnesia 35mL daily. If not helping, increase miralax to 2 capful daily - with increased water.  Hold dulcolax for now.  Start probiotic daily like activia or align or philips colon health.  If abdominal pain returns, go to ER or let us know for CT scan.

## 2019-08-20 ENCOUNTER — Other Ambulatory Visit: Payer: Self-pay | Admitting: Family Medicine

## 2019-08-20 DIAGNOSIS — Z1231 Encounter for screening mammogram for malignant neoplasm of breast: Secondary | ICD-10-CM

## 2019-08-22 ENCOUNTER — Ambulatory Visit
Admission: RE | Admit: 2019-08-22 | Discharge: 2019-08-22 | Disposition: A | Discharge status: Home / Self Care | Payer: 59 | Source: Ambulatory Visit | Attending: Family Medicine | Admitting: Family Medicine

## 2019-08-22 ENCOUNTER — Other Ambulatory Visit: Payer: Self-pay

## 2019-08-22 DIAGNOSIS — Z1231 Encounter for screening mammogram for malignant neoplasm of breast: Secondary | ICD-10-CM

## 2019-08-27 LAB — HM MAMMOGRAPHY

## 2019-08-28 ENCOUNTER — Encounter: Payer: Self-pay | Admitting: Family Medicine

## 2020-01-24 ENCOUNTER — Other Ambulatory Visit: Payer: Self-pay | Admitting: Family Medicine

## 2020-01-24 DIAGNOSIS — I10 Essential (primary) hypertension: Secondary | ICD-10-CM

## 2020-01-26 ENCOUNTER — Other Ambulatory Visit: Payer: Self-pay

## 2020-01-26 ENCOUNTER — Other Ambulatory Visit (INDEPENDENT_AMBULATORY_CARE_PROVIDER_SITE_OTHER): Payer: 59

## 2020-01-26 DIAGNOSIS — I10 Essential (primary) hypertension: Secondary | ICD-10-CM | POA: Diagnosis not present

## 2020-01-26 LAB — LIPID PANEL
Cholesterol: 206 mg/dL — ABNORMAL HIGH (ref 0–200)
HDL: 62.5 mg/dL (ref >39.00)
LDL Cholesterol: 129 mg/dL — ABNORMAL HIGH (ref 0–99)
NonHDL: 143.64
Total CHOL/HDL Ratio: 3
Triglycerides: 72 mg/dL (ref 0.0–149.0)
VLDL: 14.4 mg/dL (ref 0.0–40.0)

## 2020-01-26 LAB — BASIC METABOLIC PANEL
BUN: 16 mg/dL (ref 6–23)
CO2: 28 mEq/L (ref 19–32)
Calcium: 9 mg/dL (ref 8.4–10.5)
Chloride: 103 mEq/L (ref 96–112)
Creatinine, Ser: 0.77 mg/dL (ref 0.40–1.20)
GFR: 85.66 mL/min (ref >60.00)
Glucose, Bld: 91 mg/dL (ref 70–99)
Potassium: 3.7 mEq/L (ref 3.5–5.1)
Sodium: 140 mEq/L (ref 135–145)

## 2020-02-02 ENCOUNTER — Ambulatory Visit (INDEPENDENT_AMBULATORY_CARE_PROVIDER_SITE_OTHER): Payer: 59 | Admitting: Family Medicine

## 2020-02-02 ENCOUNTER — Encounter: Payer: Self-pay | Admitting: Family Medicine

## 2020-02-02 ENCOUNTER — Other Ambulatory Visit: Payer: Self-pay

## 2020-02-02 VITALS — BP 116/70 | HR 67 | Temp 97.7°F | Ht 65.5 in | Wt 160.4 lb

## 2020-02-02 DIAGNOSIS — I10 Essential (primary) hypertension: Secondary | ICD-10-CM | POA: Diagnosis not present

## 2020-02-02 DIAGNOSIS — Z85828 Personal history of other malignant neoplasm of skin: Secondary | ICD-10-CM

## 2020-02-02 DIAGNOSIS — E7800 Pure hypercholesterolemia, unspecified: Secondary | ICD-10-CM | POA: Diagnosis not present

## 2020-02-02 DIAGNOSIS — E785 Hyperlipidemia, unspecified: Secondary | ICD-10-CM | POA: Insufficient documentation

## 2020-02-02 DIAGNOSIS — Z0000 Encounter for general adult medical examination without abnormal findings: Secondary | ICD-10-CM

## 2020-02-02 MED ORDER — LISINOPRIL-HYDROCHLOROTHIAZIDE 10-12.5 MG PO TABS
1.0000 | ORAL_TABLET | Freq: Every morning | ORAL | 3 refills | Status: DC
Start: 2020-02-02 — End: 2021-02-02

## 2020-02-02 NOTE — Assessment & Plan Note (Signed)
Sees derm yearly. Upcoming appt 03/2020

## 2020-02-02 NOTE — Patient Instructions (Addendum)
Continue current blood pressure medicine You are doing well today Return as needed or in 1 year for next physical.   Health Maintenance for Postmenopausal Women Menopause is a normal process in which your ability to get pregnant comes to an end. This process happens slowly over many months or years, usually between the ages of 33 and 26. Menopause is complete when you have missed your menstrual periods for 12 months. It is important to talk with your health care provider about some of the most common conditions that affect women after menopause (postmenopausal women). These include heart disease, cancer, and bone loss (osteoporosis). Adopting a healthy lifestyle and getting preventive care can help to promote your health and wellness. The actions you take can also lower your chances of developing some of these common conditions. What should I know about menopause? During menopause, you may get a number of symptoms, such as:  Hot flashes. These can be moderate or severe.  Night sweats.  Decrease in sex drive.  Mood swings.  Headaches.  Tiredness.  Irritability.  Memory problems.  Insomnia. Choosing to treat or not to treat these symptoms is a decision that you make with your health care provider. Do I need hormone replacement therapy?  Hormone replacement therapy is effective in treating symptoms that are caused by menopause, such as hot flashes and night sweats.  Hormone replacement carries certain risks, especially as you become older. If you are thinking about using estrogen or estrogen with progestin, discuss the benefits and risks with your health care provider. What is my risk for heart disease and stroke? The risk of heart disease, heart attack, and stroke increases as you age. One of the causes may be a change in the body's hormones during menopause. This can affect how your body uses dietary fats, triglycerides, and cholesterol. Heart attack and stroke are medical  emergencies. There are many things that you can do to help prevent heart disease and stroke. Watch your blood pressure  High blood pressure causes heart disease and increases the risk of stroke. This is more likely to develop in people who have high blood pressure readings, are of African descent, or are overweight.  Have your blood pressure checked: ? Every 3-5 years if you are 82-90 years of age. ? Every year if you are 61 years old or older. Eat a healthy diet   Eat a diet that includes plenty of vegetables, fruits, low-fat dairy products, and lean protein.  Do not eat a lot of foods that are high in solid fats, added sugars, or sodium. Get regular exercise Get regular exercise. This is one of the most important things you can do for your health. Most adults should:  Try to exercise for at least 150 minutes each week. The exercise should increase your heart rate and make you sweat (moderate-intensity exercise).  Try to do strengthening exercises at least twice each week. Do these in addition to the moderate-intensity exercise.  Spend less time sitting. Even light physical activity can be beneficial. Other tips  Work with your health care provider to achieve or maintain a healthy weight.  Do not use any products that contain nicotine or tobacco, such as cigarettes, e-cigarettes, and chewing tobacco. If you need help quitting, ask your health care provider.  Know your numbers. Ask your health care provider to check your cholesterol and your blood sugar (glucose). Continue to have your blood tested as directed by your health care provider. Do I need screening for cancer?  Depending on your health history and family history, you may need to have cancer screening at different stages of your life. This may include screening for:  Breast cancer.  Cervical cancer.  Lung cancer.  Colorectal cancer. What is my risk for osteoporosis? After menopause, you may be at increased risk for  osteoporosis. Osteoporosis is a condition in which bone destruction happens more quickly than new bone creation. To help prevent osteoporosis or the bone fractures that can happen because of osteoporosis, you may take the following actions:  If you are 83-66 years old, get at least 1,000 mg of calcium and at least 600 mg of vitamin D per day.  If you are older than age 70 but younger than age 68, get at least 1,200 mg of calcium and at least 600 mg of vitamin D per day.  If you are older than age 2, get at least 1,200 mg of calcium and at least 800 mg of vitamin D per day. Smoking and drinking excessive alcohol increase the risk of osteoporosis. Eat foods that are rich in calcium and vitamin D, and do weight-bearing exercises several times each week as directed by your health care provider. How does menopause affect my mental health? Depression may occur at any age, but it is more common as you become older. Common symptoms of depression include:  Low or sad mood.  Changes in sleep patterns.  Changes in appetite or eating patterns.  Feeling an overall lack of motivation or enjoyment of activities that you previously enjoyed.  Frequent crying spells. Talk with your health care provider if you think that you are experiencing depression. General instructions See your health care provider for regular wellness exams and vaccines. This may include:  Scheduling regular health, dental, and eye exams.  Getting and maintaining your vaccines. These include: ? Influenza vaccine. Get this vaccine each year before the flu season begins. ? Pneumonia vaccine. ? Shingles vaccine. ? Tetanus, diphtheria, and pertussis (Tdap) booster vaccine. Your health care provider may also recommend other immunizations. Tell your health care provider if you have ever been abused or do not feel safe at home. Summary  Menopause is a normal process in which your ability to get pregnant comes to an end.  This  condition causes hot flashes, night sweats, decreased interest in sex, mood swings, headaches, or lack of sleep.  Treatment for this condition may include hormone replacement therapy.  Take actions to keep yourself healthy, including exercising regularly, eating a healthy diet, watching your weight, and checking your blood pressure and blood sugar levels.  Get screened for cancer and depression. Make sure that you are up to date with all your vaccines. This information is not intended to replace advice given to you by your health care provider. Make sure you discuss any questions you have with your health care provider. Document Revised: 01/30/2018 Document Reviewed: 01/30/2018 Elsevier Patient Education  2020 [employer].

## 2020-02-02 NOTE — Progress Notes (Signed)
Patient ID: Stacey Bean, female    DOB: 1960/05/06, 59 y.o.   MRN: [id number]  This visit was conducted in person.  BP 116/70 (BP Location: Left Arm, Patient Position: Sitting, Cuff Size: Normal)   Pulse 67   Temp 97.7 F (36.5 C) (Temporal)   Ht 5' 5.5" (1.664 m)   Wt 160 lb 7 oz (72.8 kg)   SpO2 98%   BMI 26.29 kg/m    CC: CPE Subjective:   HPI: Stacey Bean is a 59 y.o. female presenting on 02/02/2020 for Annual Exam   Notes worsening L dorsal foot pain. H/o remote fusion by ortho 1990s after slipping on ice while carrying child. Planning to see [facility] for this.   Preventative: COLONOSCOPY Date: 11/2012 tubular adenoma, mod diverticulosis, int hem, rpt 5 yrs Deatra Ina) COLONOSCOPY 12/2017 - HP, rpt 10 yrs ([facility]) Mammogram - 08/2019 [facility] with OBGYN Dr. Phineas Real (retired) in the spring -every 3 years last 2018 VAGINAL HYSTERECTOMY Date: 2004 heavy bleeding, ovaries remain Flu shotyearly at work COVID vaccine J&J 04/2019, 11/2019 Td 2009, Tdap 2018 Shingrix - 09/2017, 12/2017 Seat belt use discussed.  Sunscreen use discussed. No changing moles. Sees derm - h/o 4 BCC.  Non smoker  Alcohol - a few glasses of wine on weekend Dentist Q6 mo Eye exam yearly  Caffeine: 2 cups coffee Widowed, husband died at age 30 of colon cancer. No pets.  2 grown daughters, 1 granddaughter all in [place] Occ: works for [employer] department Activity: walks regularly 4d/wk 2.5 mi (10k steps)  Diet: good water, daily fruits/vegetables, doesn't tolerate milk due to bloating/discomfort      Relevant past medical, surgical, family and social history reviewed and updated as indicated. Interim medical history since our last visit reviewed. Allergies and medications reviewed and updated. Outpatient Medications Prior to Visit  Medication Sig Dispense Refill  . lisinopril-hydrochlorothiazide (ZESTORETIC) 10-12.5 MG tablet Take 1 tablet by mouth every morning. 90  tablet 3   No facility-administered medications prior to visit.     Per HPI unless specifically indicated in ROS section below Review of Systems  Constitutional: Negative for activity change, appetite change, chills, fatigue, fever and unexpected weight change.  HENT: Negative for hearing loss.   Eyes: Negative for visual disturbance.  Respiratory: Negative for cough, chest tightness, shortness of breath and wheezing.   Cardiovascular: Negative for chest pain, palpitations and leg swelling.  Gastrointestinal: Negative for abdominal distention, abdominal pain, blood in stool, constipation, diarrhea, nausea and vomiting.  Genitourinary: Negative for difficulty urinating and hematuria.  Musculoskeletal: Negative for arthralgias, myalgias and neck pain.  Skin: Negative for rash.  Neurological: Negative for dizziness, seizures, syncope and headaches.  Hematological: Negative for adenopathy. Does not bruise/bleed easily.  Psychiatric/Behavioral: Negative for dysphoric mood. The patient is not nervous/anxious.    Objective:  BP 116/70 (BP Location: Left Arm, Patient Position: Sitting, Cuff Size: Normal)   Pulse 67   Temp 97.7 F (36.5 C) (Temporal)   Ht 5' 5.5" (1.664 m)   Wt 160 lb 7 oz (72.8 kg)   SpO2 98%   BMI 26.29 kg/m   Wt Readings from Last 3 Encounters:  02/02/20 160 lb 7 oz (72.8 kg)  04/24/19 164 lb 2 oz (74.4 kg)  03/26/19 159 lb (72.1 kg)      Physical Exam Vitals and nursing note reviewed.  Constitutional:      General: She is not in acute distress.    Appearance: Normal  appearance. She is well-developed and well-nourished. She is not ill-appearing.  HENT:     Head: Normocephalic and atraumatic.     Right Ear: Hearing, tympanic membrane, ear canal and external ear normal.     Left Ear: Hearing, tympanic membrane, ear canal and external ear normal.     Mouth/Throat:     Mouth: Oropharynx is clear and moist and mucous membranes are normal.     Pharynx: No posterior  oropharyngeal edema.  Eyes:     General: No scleral icterus.    Extraocular Movements: Extraocular movements intact and EOM normal.     Conjunctiva/sclera: Conjunctivae normal.     Pupils: Pupils are equal, round, and reactive to light.  Neck:     Thyroid: No thyroid mass or thyromegaly.  Cardiovascular:     Rate and Rhythm: Normal rate and regular rhythm.     Pulses: Normal pulses and intact distal pulses.          Radial pulses are 2+ on the right side and 2+ on the left side.     Heart sounds: Normal heart sounds. No murmur heard.   Pulmonary:     Effort: Pulmonary effort is normal. No respiratory distress.     Breath sounds: Normal breath sounds. No wheezing, rhonchi or rales.  Abdominal:     General: Abdomen is flat. Bowel sounds are normal. There is no distension.     Palpations: Abdomen is soft. There is no mass.     Tenderness: There is no abdominal tenderness. There is no guarding or rebound.     Hernia: No hernia is present.  Musculoskeletal:        General: No edema. Normal range of motion.     Cervical back: Normal range of motion and neck supple.     Right lower leg: No edema.     Left lower leg: No edema.  Lymphadenopathy:     Cervical: No cervical adenopathy.  Skin:    General: Skin is warm and dry.     Findings: No rash.  Neurological:     General: No focal deficit present.     Mental Status: She is alert and oriented to person, place, and time.     Comments: CN grossly intact, station and gait intact  Psychiatric:        Mood and Affect: Mood and affect and mood normal.        Behavior: Behavior normal.        Thought Content: Thought content normal.        Judgment: Judgment normal.       Results for orders placed or performed in visit on 66/06/30  Basic metabolic panel  Result Value Ref Range   Sodium 140 135 - 145 mEq/L   Potassium 3.7 3.5 - 5.1 mEq/L   Chloride 103 96 - 112 mEq/L   CO2 28 19 - 32 mEq/L   Glucose, Bld 91 70 - 99 mg/dL   BUN 16  6 - 23 mg/dL   Creatinine, Ser 0.77 0.40 - 1.20 mg/dL   GFR 85.66 >60.00 mL/min   Calcium 9.0 8.4 - 10.5 mg/dL  Lipid panel  Result Value Ref Range   Cholesterol 206 (H) 0 - 200 mg/dL   Triglycerides 72.0 0.0 - 149.0 mg/dL   HDL 62.50 >39.00 mg/dL   VLDL 14.4 0.0 - 40.0 mg/dL   LDL Cholesterol 129 (H) 0 - 99 mg/dL   Total CHOL/HDL Ratio 3    NonHDL 143.64  Assessment & Plan:  This visit occurred during the SARS-CoV-2 public health emergency.  Safety protocols were in place, including screening questions prior to the visit, additional usage of staff PPE, and extensive cleaning of exam room while observing appropriate contact time as indicated for disinfecting solutions.   Problem List Items Addressed This Visit    Hyperlipidemia    Mild, off meds. Encouraged low chol diet. The 10-year ASCVD risk score Mikey Bussing DC Brooke Bonito., et al., 2013) is: 2.5%   Values used to calculate the score:     Age: 59 years     Sex: Female     Is Non-Hispanic African American: No     Diabetic: No     Tobacco smoker: No     Systolic Blood Pressure: 628 mmHg     Is BP treated: Yes     HDL Cholesterol: 62.5 mg/dL     Total Cholesterol: 206 mg/dL       Relevant Medications   lisinopril-hydrochlorothiazide (ZESTORETIC) 10-12.5 MG tablet   History of basal cell cancer    Sees derm yearly. Upcoming appt 03/2020      Health maintenance examination - Primary    Preventative protocols reviewed and updated unless pt declined. Discussed healthy diet and lifestyle.       Essential hypertension    Chronic, stable. Continue current regimen.       Relevant Medications   lisinopril-hydrochlorothiazide (ZESTORETIC) 10-12.5 MG tablet       Meds ordered this encounter  Medications  . lisinopril-hydrochlorothiazide (ZESTORETIC) 10-12.5 MG tablet    Sig: Take 1 tablet by mouth every morning.    Dispense:  90 tablet    Refill:  3   No orders of the defined types were placed in this encounter.   Patient  instructions: Continue current blood pressure medicine You are doing well today Return as needed or in 1 year for next physical.   Follow up plan: Return in about 1 year (around 02/01/2021) for annual exam, prior fasting for blood work.  Ria Bush, MD

## 2020-02-02 NOTE — Assessment & Plan Note (Signed)
Preventative protocols reviewed and updated unless pt declined. Discussed healthy diet and lifestyle.  

## 2020-02-02 NOTE — Assessment & Plan Note (Signed)
Chronic, stable. Continue current regimen. 

## 2020-02-02 NOTE — Assessment & Plan Note (Signed)
Mild, off meds. Encouraged low chol diet. The 10-year ASCVD risk score Mikey Bussing DC Brooke Bonito., et al., 2013) is: 2.5%   Values used to calculate the score:     Age: 59 years     Sex: Female     Is Non-Hispanic African American: No     Diabetic: No     Tobacco smoker: No     Systolic Blood Pressure: 068 mmHg     Is BP treated: Yes     HDL Cholesterol: 62.5 mg/dL     Total Cholesterol: 206 mg/dL

## 2020-03-25 ENCOUNTER — Other Ambulatory Visit: Payer: Self-pay

## 2020-03-25 ENCOUNTER — Ambulatory Visit: Payer: 59 | Admitting: Physician Assistant

## 2020-03-25 ENCOUNTER — Encounter: Payer: Self-pay | Admitting: Physician Assistant

## 2020-03-25 DIAGNOSIS — L910 Hypertrophic scar: Secondary | ICD-10-CM

## 2020-03-25 DIAGNOSIS — Z1283 Encounter for screening for malignant neoplasm of skin: Secondary | ICD-10-CM

## 2020-03-25 DIAGNOSIS — D485 Neoplasm of uncertain behavior of skin: Secondary | ICD-10-CM

## 2020-03-25 DIAGNOSIS — Z85828 Personal history of other malignant neoplasm of skin: Secondary | ICD-10-CM

## 2020-03-25 MED ORDER — TRIAMCINOLONE ACETONIDE 40 MG/ML IJ SUSP
20.0000 mg | Freq: Once | INTRAMUSCULAR | Status: AC
  Administered 2020-03-25: 20 mg

## 2020-03-25 NOTE — Patient Instructions (Signed)

## 2020-04-15 ENCOUNTER — Encounter: Payer: Self-pay | Admitting: Physician Assistant

## 2020-05-06 ENCOUNTER — Telehealth: Payer: Self-pay

## 2020-05-06 NOTE — Telephone Encounter (Signed)
Phone call to patient with her pathology results. Patient aware of results.  

## 2020-05-06 NOTE — Telephone Encounter (Signed)
-----   Message from Warren Danes, [place] sent at 05/05/2020  4:34 PM EDT ----- R breast rtc if recurs

## 2020-05-10 NOTE — Progress Notes (Signed)
Follow-Up Visit   Subjective  Stacey Bean is a 60 y.o. female who presents for the following: Annual Exam (Few spots on right chest & left leg- scaly).   The following portions of the chart were reviewed this encounter and updated as appropriate:  Tobacco  Allergies  Meds  Problems  Med Hx  Surg Hx  Fam Hx      Objective  Well appearing patient in no apparent distress; mood and affect are within normal limits.  A full examination was performed including scalp, head, eyes, ears, nose, lips, neck, chest, axillae, abdomen, back, buttocks, bilateral upper extremities, bilateral lower extremities, hands, feet, fingers, toes, fingernails, and toenails. All findings within normal limits unless otherwise noted below.  Objective  Chest - Medial [facility]): Full body skin examination  Objective  Left Breast: Multiple white scar- clear  Objective  Left Lower Leg - Anterior: Pink pearly papule        Objective  Right Breast: Pink pearly papule     Objective  Right Abdomen (side) - Upper: Firm pink dermal papules/plaques c/w keloid.    Assessment & Plan  Encounter for screening for malignant neoplasm of skin Chest - Medial (Center)  Yearly skin check  History of basal cell cancer Left Breast  Yearly skin check  Neoplasm of uncertain behavior of skin (2) Left Lower Leg - Anterior  Skin / nail biopsy Type of biopsy: tangential   Informed consent: discussed and consent obtained   Timeout: patient name, date of birth, surgical site, and procedure verified   Procedure prep:  Patient was prepped and draped in usual sterile fashion (Non sterile) Prep type:  Chlorhexidine Anesthesia: the lesion was anesthetized in a standard fashion   Anesthetic:  1% lidocaine w/ epinephrine 1-100,000 local infiltration Instrument used: flexible razor blade   Outcome: patient tolerated procedure well   Post-procedure details: wound care instructions given    Destruction of  lesion Complexity: simple   Destruction method: electrodesiccation and curettage   Informed consent: discussed and consent obtained   Timeout:  patient name, date of birth, surgical site, and procedure verified Anesthesia: the lesion was anesthetized in a standard fashion   Anesthetic:  1% lidocaine w/ epinephrine 1-100,000 local infiltration Curettage performed in three different directions: Yes   Curettage cycles:  3 Margin per side (cm):  0.1 Final wound size (cm):  1.4 Hemostasis achieved with:  aluminum chloride Outcome: patient tolerated procedure well with no complications   Post-procedure details: wound care instructions given    Specimen 1 - Surgical pathology Differential Diagnosis: bcc vs scc            [id number] Check Margins: No  Right Breast  Skin / nail biopsy Type of biopsy: tangential   Informed consent: discussed and consent obtained   Timeout: patient name, date of birth, surgical site, and procedure verified   Procedure prep:  Patient was prepped and draped in usual sterile fashion (Non sterile) Prep type:  Chlorhexidine Anesthesia: the lesion was anesthetized in a standard fashion   Anesthetic:  1% lidocaine w/ epinephrine 1-100,000 local infiltration Instrument used: flexible razor blade   Outcome: patient tolerated procedure well   Post-procedure details: wound care instructions given    Destruction of lesion Complexity: simple   Destruction method: electrodesiccation and curettage   Informed consent: discussed and consent obtained   Timeout:  patient name, date of birth, surgical site, and procedure verified Anesthesia: the lesion was anesthetized in a standard fashion  Anesthetic:  1% lidocaine w/ epinephrine 1-100,000 local infiltration Curettage performed in three different directions: Yes   Curettage cycles:  3 Margin per side (cm):  0.1 Final wound size (cm):  0.9 Hemostasis achieved with:  aluminum chloride Outcome: patient tolerated  procedure well with no complications   Post-procedure details: wound care instructions given    Specimen 2 - Surgical pathology Differential Diagnosis: bcc vs scc  Check Margins: No  Keloid Right Abdomen (side) - Upper  observe    I, Taylan Marez, PA-C, have reviewed all documentation's for this visit.  The documentation on 05/10/20 for the exam, diagnosis, procedures and orders are all accurate and complete.

## 2020-07-26 ENCOUNTER — Other Ambulatory Visit: Payer: Self-pay | Admitting: Family Medicine

## 2020-07-26 DIAGNOSIS — Z1231 Encounter for screening mammogram for malignant neoplasm of breast: Secondary | ICD-10-CM

## 2020-07-27 ENCOUNTER — Encounter: Payer: Self-pay | Admitting: Physician Assistant

## 2020-07-27 ENCOUNTER — Other Ambulatory Visit: Payer: Self-pay

## 2020-07-27 ENCOUNTER — Ambulatory Visit (INDEPENDENT_AMBULATORY_CARE_PROVIDER_SITE_OTHER): Payer: 59 | Admitting: Physician Assistant

## 2020-07-27 DIAGNOSIS — L720 Epidermal cyst: Secondary | ICD-10-CM | POA: Diagnosis not present

## 2020-07-27 DIAGNOSIS — D485 Neoplasm of uncertain behavior of skin: Secondary | ICD-10-CM

## 2020-07-29 NOTE — Progress Notes (Signed)
   Follow-Up Visit   Subjective  Stacey Bean is a 60 y.o. female who presents for the following: Procedure (Patient here today to have a cyst removed from her right shoulder. ).   The following portions of the chart were reviewed this encounter and updated as appropriate:  Tobacco  Allergies  Meds  Problems  Med Hx  Surg Hx  Fam Hx       Objective  Well appearing patient in no apparent distress; mood and affect are within normal limits.  A focused examination was performed including back. Relevant physical exam findings are noted in the Assessment and Plan.  Right Upper Back 1.5 cm lump, soft and immobile   Assessment & Plan  Neoplasm of uncertain behavior of skin Right Upper Back  Skin / nail biopsy Type of biopsy: punch   Informed consent: discussed and consent obtained   Timeout: patient name, date of birth, surgical site, and procedure verified   Procedure prep:  Patient was prepped and draped in usual sterile fashion (Non sterile) Prep type:  Chlorhexidine Anesthesia: the lesion was anesthetized in a standard fashion   Anesthetic:  1% lidocaine w/ epinephrine 1-100,000 local infiltration Punch size:  8 mm Suture size:  4-0 Suture type: Monocryl (poliglecaprone 25)   Suture removal (days):  12 Hemostasis achieved with: suture   Outcome: patient tolerated procedure well   Additional details:  Vicryl x 1 Ethilon x 2  Specimen 1 - Surgical pathology Differential Diagnosis: r/o cyst  Check Margins: No    I, Majorie Santee, PA-C, have reviewed all documentation's for this visit.  The documentation on 07/29/20 for the exam, diagnosis, procedures and orders are all accurate and complete.

## 2020-08-05 ENCOUNTER — Ambulatory Visit (INDEPENDENT_AMBULATORY_CARE_PROVIDER_SITE_OTHER): Payer: 59 | Admitting: *Deleted

## 2020-08-05 ENCOUNTER — Other Ambulatory Visit: Payer: Self-pay

## 2020-08-05 DIAGNOSIS — Z4802 Encounter for removal of sutures: Secondary | ICD-10-CM

## 2020-08-05 NOTE — Progress Notes (Signed)
Here for NTS suture removal. No signs or symptoms of infection. Path to patient. 

## 2020-09-21 ENCOUNTER — Ambulatory Visit
Admission: RE | Admit: 2020-09-21 | Discharge: 2020-09-21 | Disposition: A | Discharge status: Home / Self Care | Payer: 59 | Source: Ambulatory Visit | Attending: Family Medicine | Admitting: Family Medicine

## 2020-09-21 ENCOUNTER — Other Ambulatory Visit: Payer: Self-pay

## 2020-09-21 DIAGNOSIS — Z1231 Encounter for screening mammogram for malignant neoplasm of breast: Secondary | ICD-10-CM

## 2020-09-24 ENCOUNTER — Other Ambulatory Visit: Payer: Self-pay | Admitting: Family Medicine

## 2020-09-24 DIAGNOSIS — R928 Other abnormal and inconclusive findings on diagnostic imaging of breast: Secondary | ICD-10-CM

## 2020-09-27 ENCOUNTER — Ambulatory Visit
Admission: RE | Admit: 2020-09-27 | Discharge: 2020-09-27 | Disposition: A | Discharge status: Home / Self Care | Payer: 59 | Source: Ambulatory Visit | Attending: Family Medicine | Admitting: Family Medicine

## 2020-09-27 ENCOUNTER — Other Ambulatory Visit: Payer: Self-pay

## 2020-09-27 ENCOUNTER — Other Ambulatory Visit: Payer: Self-pay | Admitting: Family Medicine

## 2020-09-27 DIAGNOSIS — R928 Other abnormal and inconclusive findings on diagnostic imaging of breast: Secondary | ICD-10-CM

## 2020-09-28 ENCOUNTER — Ambulatory Visit
Admission: RE | Admit: 2020-09-28 | Discharge: 2020-09-28 | Disposition: A | Discharge status: Home / Self Care | Payer: 59 | Source: Ambulatory Visit | Attending: Family Medicine | Admitting: Family Medicine

## 2020-09-28 DIAGNOSIS — R928 Other abnormal and inconclusive findings on diagnostic imaging of breast: Secondary | ICD-10-CM

## 2021-01-23 ENCOUNTER — Other Ambulatory Visit: Payer: Self-pay | Admitting: Family Medicine

## 2021-01-23 DIAGNOSIS — E7800 Pure hypercholesterolemia, unspecified: Secondary | ICD-10-CM

## 2021-01-26 ENCOUNTER — Other Ambulatory Visit: Payer: 59

## 2021-02-02 ENCOUNTER — Ambulatory Visit (INDEPENDENT_AMBULATORY_CARE_PROVIDER_SITE_OTHER): Payer: 59 | Admitting: Family Medicine

## 2021-02-02 ENCOUNTER — Encounter: Payer: Self-pay | Admitting: Family Medicine

## 2021-02-02 ENCOUNTER — Other Ambulatory Visit: Payer: Self-pay

## 2021-02-02 VITALS — BP 122/80 | HR 62 | Temp 97.8°F | Ht 65.5 in | Wt 162.5 lb

## 2021-02-02 DIAGNOSIS — I10 Essential (primary) hypertension: Secondary | ICD-10-CM

## 2021-02-02 DIAGNOSIS — E7800 Pure hypercholesterolemia, unspecified: Secondary | ICD-10-CM

## 2021-02-02 DIAGNOSIS — Z0000 Encounter for general adult medical examination without abnormal findings: Secondary | ICD-10-CM | POA: Diagnosis not present

## 2021-02-02 MED ORDER — LISINOPRIL-HYDROCHLOROTHIAZIDE 10-12.5 MG PO TABS: 1.0000 | ORAL_TABLET | Freq: Every morning | ORAL | 3 refills | Status: DC

## 2021-02-02 NOTE — Assessment & Plan Note (Signed)
Update FLP off statin. Encouraged increased fiber and legumes to improve LDL levels. The 10-year ASCVD risk score (Arnett DK, et al., 2019) is: 3%   Values used to calculate the score:     Age: 60 years     Sex: Female     Is Non-Hispanic African American: No     Diabetic: No     Tobacco smoker: No     Systolic Blood Pressure: 361 mmHg     Is BP treated: Yes     HDL Cholesterol: 62.5 mg/dL     Total Cholesterol: 206 mg/dL

## 2021-02-02 NOTE — Assessment & Plan Note (Addendum)
Chronic, stable on zestoretic - continue.  Reviewed side effect/allergy symptoms to monitor for on this medication.

## 2021-02-02 NOTE — Progress Notes (Signed)
Patient ID: Stacey Bean, female    DOB: [birth year], 60 y.o.   MRN: [id number]  This visit was conducted in person.  BP 122/80    Pulse 62    Temp 97.8 F (36.6 C) (Temporal)    Ht 5' 5.5" (1.664 m)    Wt 162 lb 8 oz (73.7 kg)    SpO2 98%    BMI 26.63 kg/m    CC: CPE Subjective:   HPI: Stacey Bean is a 60 y.o. female presenting on 02/02/2021 for Annual Exam   Last EKG 2015 WNL.  H/o remote fusion by ortho 1990s after slipping on ice while carrying a child. Planning to see [facility] for this.  COVID infection over the summer 2022  Preventative: COLONOSCOPY Date: 11/2012 tubular adenoma, mod diverticulosis, int hem, rpt 5 yrs Deatra Ina) COLONOSCOPY 12/2017 - HP, rpt 10 yrs ([facility])  Mammogram - 09/2020 abnormal calcifications to L breast s/p biopsy showing benign fibroadenoma with calcifications rec continue yearly screening mammo @ [facility] woman with OBGYN Dr. Phineas Real (retired) in the spring - every 3 years last 2018 VAGINAL HYSTERECTOMY Date: 2004 fibroids, ovaries remain  Lung cancer screening - not eligible  Flu shot yearly at work  COVID vaccine J&J 04/2019, 11/2019  Td 2009, Tdap 2018 Shingrix - 09/2017, 12/2017 Seat belt use discussed.  Sunscreen use discussed. No changing moles. Sees derm yearly  Sleep - averaging 8 hours/night No significant smoking history  Alcohol - a few glasses of wine on weekend  Dentist Q6 mo Eye exam yearly    Caffeine: 2 cups coffee Widowed, husband died at age 92 of colon cancer. No pets.  2 grown daughters, 1 granddaughter all in [place] Occ: works for [employer] department Activity: walks regularly 4d/wk 2.5 mi (10k steps)  Diet: good water, daily fruits/vegetables, some lactose intolerance      Relevant past medical, surgical, family and social history reviewed and updated as indicated. Interim medical history since our last visit reviewed. Allergies and medications reviewed and updated. Outpatient Medications Prior to  Visit  Medication Sig Dispense Refill   lisinopril-hydrochlorothiazide (ZESTORETIC) 10-12.5 MG tablet Take 1 tablet by mouth every morning. 90 tablet 3   No facility-administered medications prior to visit.     Per HPI unless specifically indicated in ROS section below Review of Systems  Constitutional:  Negative for activity change, appetite change, chills, fatigue, fever and unexpected weight change.  HENT:  Negative for hearing loss.   Eyes:  Negative for visual disturbance.  Respiratory:  Negative for cough, chest tightness, shortness of breath and wheezing.   Cardiovascular:  Negative for chest pain, palpitations and leg swelling.  Gastrointestinal:  Negative for abdominal distention, abdominal pain, blood in stool, constipation, diarrhea, nausea and vomiting.  Genitourinary:  Negative for difficulty urinating and hematuria.  Musculoskeletal:  Negative for arthralgias, myalgias and neck pain.  Skin:  Negative for rash.  Neurological:  Negative for dizziness, seizures, syncope and headaches.  Hematological:  Negative for adenopathy. Does not bruise/bleed easily.  Psychiatric/Behavioral:  Negative for dysphoric mood. The patient is not nervous/anxious.    Objective:  BP 122/80    Pulse 62    Temp 97.8 F (36.6 C) (Temporal)    Ht 5' 5.5" (1.664 m)    Wt 162 lb 8 oz (73.7 kg)    SpO2 98%    BMI 26.63 kg/m   Wt Readings from Last 3 Encounters:  02/02/21 162 lb 8 oz (73.7 kg)  02/02/20  160 lb 7 oz (72.8 kg)  04/24/19 164 lb 2 oz (74.4 kg)      Physical Exam Vitals and nursing note reviewed.  Constitutional:      Appearance: Normal appearance. She is not ill-appearing.  HENT:     Head: Normocephalic and atraumatic.     Right Ear: Tympanic membrane, ear canal and external ear normal. There is no impacted cerumen.     Left Ear: Tympanic membrane, ear canal and external ear normal. There is no impacted cerumen.  Eyes:     General:        Right eye: No discharge.        Left  eye: No discharge.     Extraocular Movements: Extraocular movements intact.     Conjunctiva/sclera: Conjunctivae normal.     Pupils: Pupils are equal, round, and reactive to light.  Neck:     Thyroid: No thyroid mass or thyromegaly.  Cardiovascular:     Rate and Rhythm: Normal rate and regular rhythm.     Pulses: Normal pulses.     Heart sounds: Normal heart sounds. No murmur heard. Pulmonary:     Effort: Pulmonary effort is normal. No respiratory distress.     Breath sounds: Normal breath sounds. No wheezing, rhonchi or rales.  Abdominal:     General: Bowel sounds are normal. There is no distension.     Palpations: Abdomen is soft. There is no mass.     Tenderness: There is no abdominal tenderness. There is no guarding or rebound.     Hernia: No hernia is present.  Musculoskeletal:     Cervical back: Normal range of motion and neck supple. No rigidity.     Right lower leg: No edema.     Left lower leg: No edema.  Lymphadenopathy:     Cervical: No cervical adenopathy.  Skin:    General: Skin is warm and dry.     Findings: No rash.  Neurological:     General: No focal deficit present.     Mental Status: She is alert. Mental status is at baseline.  Psychiatric:        Mood and Affect: Mood normal.        Behavior: Behavior normal.      Results for orders placed or performed in visit on 74/08/14  Basic metabolic panel  Result Value Ref Range   Sodium 140 135 - 145 mEq/L   Potassium 3.7 3.5 - 5.1 mEq/L   Chloride 103 96 - 112 mEq/L   CO2 28 19 - 32 mEq/L   Glucose, Bld 91 70 - 99 mg/dL   BUN 16 6 - 23 mg/dL   Creatinine, Ser 0.77 0.40 - 1.20 mg/dL   GFR 85.66 >60.00 mL/min   Calcium 9.0 8.4 - 10.5 mg/dL  Lipid panel  Result Value Ref Range   Cholesterol 206 (H) 0 - 200 mg/dL   Triglycerides 72.0 0.0 - 149.0 mg/dL   HDL 62.50 >39.00 mg/dL   VLDL 14.4 0.0 - 40.0 mg/dL   LDL Cholesterol 129 (H) 0 - 99 mg/dL   Total CHOL/HDL Ratio 3    NonHDL 143.64     Assessment &  Plan:  This visit occurred during the SARS-CoV-2 public health emergency.  Safety protocols were in place, including screening questions prior to the visit, additional usage of staff PPE, and extensive cleaning of exam room while observing appropriate contact time as indicated for disinfecting solutions.   Problem List Items Addressed This Visit  Health maintenance examination - Primary (Chronic)    Preventative protocols reviewed and updated unless pt declined. Discussed healthy diet and lifestyle.       Essential hypertension    Chronic, stable on zestoretic - continue.  Reviewed side effect/allergy symptoms to monitor for on this medication.       Relevant Medications   lisinopril-hydrochlorothiazide (ZESTORETIC) 10-12.5 MG tablet   Hyperlipidemia    Update FLP off statin. Encouraged increased fiber and legumes to improve LDL levels. The 10-year ASCVD risk score (Arnett DK, et al., 2019) is: 3%   Values used to calculate the score:     Age: 28 years     Sex: Female     Is Non-Hispanic African American: No     Diabetic: No     Tobacco smoker: No     Systolic Blood Pressure: 903 mmHg     Is BP treated: Yes     HDL Cholesterol: 62.5 mg/dL     Total Cholesterol: 206 mg/dL       Relevant Medications   lisinopril-hydrochlorothiazide (ZESTORETIC) 10-12.5 MG tablet   Other Relevant Orders   TSH     Meds ordered this encounter  Medications   lisinopril-hydrochlorothiazide (ZESTORETIC) 10-12.5 MG tablet    Sig: Take 1 tablet by mouth every morning.    Dispense:  90 tablet    Refill:  3   Orders Placed This Encounter  Procedures   TSH    Patient instructions: You are doing well today Labs today Continue blood pressure medicine Return as needed or in 1 year for next physical.  Follow up plan: Return in about 1 year (around 02/02/2022) for annual exam, prior fasting for blood work.  Ria Bush, MD

## 2021-02-02 NOTE — Assessment & Plan Note (Signed)
Preventative protocols reviewed and updated unless pt declined. Discussed healthy diet and lifestyle.  

## 2021-02-02 NOTE — Patient Instructions (Addendum)
You are doing well today Labs today Continue blood pressure medicine Return as needed or in 1 year for next physical.  Health Maintenance for Postmenopausal Women Menopause is a normal process in which your ability to get pregnant comes to an end. This process happens slowly over many months or years, usually between the ages of 46 and 7. Menopause is complete when you have missed your menstrual period for 12 months. It is important to talk with your health care provider about some of the most common conditions that affect women after menopause (postmenopausal women). These include heart disease, cancer, and bone loss (osteoporosis). Adopting a healthy lifestyle and getting preventive care can help to promote your health and wellness. The actions you take can also lower your chances of developing some of these common conditions. What are the signs and symptoms of menopause? During menopause, you may have the following symptoms: Hot flashes. These can be moderate or severe. Night sweats. Decrease in sex drive. Mood swings. Headaches. Tiredness (fatigue). Irritability. Memory problems. Problems falling asleep or staying asleep. Talk with your health care provider about treatment options for your symptoms. Do I need hormone replacement therapy? Hormone replacement therapy is effective in treating symptoms that are caused by menopause, such as hot flashes and night sweats. Hormone replacement carries certain risks, especially as you become older. If you are thinking about using estrogen or estrogen with progestin, discuss the benefits and risks with your health care provider. How can I reduce my risk for heart disease and stroke? The risk of heart disease, heart attack, and stroke increases as you age. One of the causes may be a change in the body's hormones during menopause. This can affect how your body uses dietary fats, triglycerides, and cholesterol. Heart attack and stroke are medical  emergencies. There are many things that you can do to help prevent heart disease and stroke. Watch your blood pressure High blood pressure causes heart disease and increases the risk of stroke. This is more likely to develop in people who have high blood pressure readings or are overweight. Have your blood pressure checked: Every 3-5 years if you are 36-46 years of age. Every year if you are 35 years old or older. Eat a healthy diet  Eat a diet that includes plenty of vegetables, fruits, low-fat dairy products, and lean protein. Do not eat a lot of foods that are high in solid fats, added sugars, or sodium. Get regular exercise Get regular exercise. This is one of the most important things you can do for your health. Most adults should: Try to exercise for at least 150 minutes each week. The exercise should increase your heart rate and make you sweat (moderate-intensity exercise). Try to do strengthening exercises at least twice each week. Do these in addition to the moderate-intensity exercise. Spend less time sitting. Even light physical activity can be beneficial. Other tips Work with your health care provider to achieve or maintain a healthy weight. Do not use any products that contain nicotine or tobacco. These products include cigarettes, chewing tobacco, and vaping devices, such as e-cigarettes. If you need help quitting, ask your health care provider. Know your numbers. Ask your health care provider to check your cholesterol and your blood sugar (glucose). Continue to have your blood tested as directed by your health care provider. Do I need screening for cancer? Depending on your health history and family history, you may need to have cancer screenings at different stages of your life. This may  include screening for: Breast cancer. Cervical cancer. Lung cancer. Colorectal cancer. What is my risk for osteoporosis? After menopause, you may be at increased risk for osteoporosis.  Osteoporosis is a condition in which bone destruction happens more quickly than new bone creation. To help prevent osteoporosis or the bone fractures that can happen because of osteoporosis, you may take the following actions: If you are 7-28 years old, get at least 1,000 mg of calcium and at least 600 international units (IU) of vitamin D per day. If you are older than age 91 but younger than age 58, get at least 1,200 mg of calcium and at least 600 international units (IU) of vitamin D per day. If you are older than age 55, get at least 1,200 mg of calcium and at least 800 international units (IU) of vitamin D per day. Smoking and drinking excessive alcohol increase the risk of osteoporosis. Eat foods that are rich in calcium and vitamin D, and do weight-bearing exercises several times each week as directed by your health care provider. How does menopause affect my mental health? Depression may occur at any age, but it is more common as you become older. Common symptoms of depression include: Feeling depressed. Changes in sleep patterns. Changes in appetite or eating patterns. Feeling an overall lack of motivation or enjoyment of activities that you previously enjoyed. Frequent crying spells. Talk with your health care provider if you think that you are experiencing any of these symptoms. General instructions See your health care provider for regular wellness exams and vaccines. This may include: Scheduling regular health, dental, and eye exams. Getting and maintaining your vaccines. These include: Influenza vaccine. Get this vaccine each year before the flu season begins. Pneumonia vaccine. Shingles vaccine. Tetanus, diphtheria, and pertussis (Tdap) booster vaccine. Your health care provider may also recommend other immunizations. Tell your health care provider if you have ever been abused or do not feel safe at home. Summary Menopause is a normal process in which your ability to get  pregnant comes to an end. This condition causes hot flashes, night sweats, decreased interest in sex, mood swings, headaches, or lack of sleep. Treatment for this condition may include hormone replacement therapy. Take actions to keep yourself healthy, including exercising regularly, eating a healthy diet, watching your weight, and checking your blood pressure and blood sugar levels. Get screened for cancer and depression. Make sure that you are up to date with all your vaccines. This information is not intended to replace advice given to you by your health care provider. Make sure you discuss any questions you have with your health care provider. Document Revised: 06/28/2020 Document Reviewed: 06/28/2020 Elsevier Patient Education  [place].

## 2021-02-03 LAB — COMPREHENSIVE METABOLIC PANEL
ALT: 9 U/L (ref 0–35)
AST: 15 U/L (ref 0–37)
Albumin: 4.2 g/dL (ref 3.5–5.2)
Alkaline Phosphatase: 56 U/L (ref 39–117)
BUN: 18 mg/dL (ref 6–23)
CO2: 30 mEq/L (ref 19–32)
Calcium: 9.7 mg/dL (ref 8.4–10.5)
Chloride: 101 mEq/L (ref 96–112)
Creatinine, Ser: 0.71 mg/dL (ref 0.40–1.20)
GFR: 93.74 mL/min (ref >60.00)
Glucose, Bld: 81 mg/dL (ref 70–99)
Potassium: 4.2 mEq/L (ref 3.5–5.1)
Sodium: 140 mEq/L (ref 135–145)
Total Bilirubin: 0.6 mg/dL (ref 0.2–1.2)
Total Protein: 7.1 g/dL (ref 6.0–8.3)

## 2021-02-03 LAB — LIPID PANEL
Cholesterol: 203 mg/dL — ABNORMAL HIGH (ref 0–200)
HDL: 64.9 mg/dL (ref >39.00)
LDL Cholesterol: 121 mg/dL — ABNORMAL HIGH (ref 0–99)
NonHDL: 138.26
Total CHOL/HDL Ratio: 3
Triglycerides: 87 mg/dL (ref 0.0–149.0)
VLDL: 17.4 mg/dL (ref 0.0–40.0)

## 2021-02-03 LAB — TSH: TSH: 1.11 u[IU]/mL (ref 0.35–5.50)

## 2021-04-15 LAB — HM PAP SMEAR: HPV, high-risk: NEGATIVE

## 2021-07-26 ENCOUNTER — Ambulatory Visit: Payer: 59 | Admitting: Physician Assistant

## 2021-07-26 ENCOUNTER — Encounter: Payer: Self-pay | Admitting: Physician Assistant

## 2021-07-26 DIAGNOSIS — Z85828 Personal history of other malignant neoplasm of skin: Secondary | ICD-10-CM | POA: Diagnosis not present

## 2021-07-26 DIAGNOSIS — L570 Actinic keratosis: Secondary | ICD-10-CM

## 2021-07-26 DIAGNOSIS — Z808 Family history of malignant neoplasm of other organs or systems: Secondary | ICD-10-CM | POA: Diagnosis not present

## 2021-07-26 DIAGNOSIS — Z1283 Encounter for screening for malignant neoplasm of skin: Secondary | ICD-10-CM

## 2021-07-26 MED ORDER — FLUOROURACIL 5 % EX CREA
TOPICAL_CREAM | Freq: Every evening | CUTANEOUS | 0 refills | Status: DC
Start: 2021-07-26 — End: 2021-08-04

## 2021-08-01 ENCOUNTER — Encounter: Payer: Self-pay | Admitting: Physician Assistant

## 2021-08-01 NOTE — Progress Notes (Signed)
   Follow-Up Visit   Subjective  Stacey Bean is a 61 y.o. female who presents for the following: Annual Exam (Patient here today for yearly skin check no concerns. Personal history of non mole skin cancer. Family history of melanoma (mother).).   The following portions of the chart were reviewed this encounter and updated as appropriate:  Tobacco  Allergies  Meds  Problems  Med Hx  Surg Hx  Fam Hx      Objective  Well appearing patient in no apparent distress; mood and affect are within normal limits.  A full examination was performed including scalp, head, eyes, ears, nose, lips, neck, chest, axillae, abdomen, back, buttocks, bilateral upper extremities, bilateral lower extremities, hands, feet, fingers, toes, fingernails, and toenails. All findings within normal limits unless otherwise noted below.  Chest (Upper Torso, Anterior) Erythematous patches with gritty scale. Diffuse pink scale    Assessment & Plan  Actinic keratosis Chest (Upper Torso, Anterior)  fluorouracil (EFUDEX) 5 % cream - Chest (Upper Torso, Anterior) Apply topically at bedtime.    I, Earland Reish, PA-C, have reviewed all documentation's for this visit.  The documentation on 08/01/21 for the exam, diagnosis, procedures and orders are all accurate and complete.

## 2021-08-03 ENCOUNTER — Telehealth: Payer: Self-pay | Admitting: Physician Assistant

## 2021-08-03 DIAGNOSIS — L570 Actinic keratosis: Secondary | ICD-10-CM

## 2021-08-03 NOTE — Telephone Encounter (Signed)
Patient is having trouble getting prescription from [facility] so would like prescription sent to [facility] [address] in [place], [place].  fluorouracil fluorouracil (EFUDEX) 5 % cream

## 2021-08-04 MED ORDER — FLUOROURACIL 5 % EX CREA
TOPICAL_CREAM | Freq: Every evening | CUTANEOUS | 0 refills | Status: DC
Start: 2021-08-04 — End: 2022-09-22

## 2021-08-04 NOTE — Addendum Note (Signed)
Addended by: Lennie Odor on: 08/04/2021 09:49 AM   Modules accepted: Orders

## 2021-08-04 NOTE — Telephone Encounter (Signed)
Phone call to patient to see why she couldn't get the prescription from the [facility]. Patient states that [facility] told her the prescription wasn't covered by her insurance. Patient states that she contacted her insurance company and they informed her that the prescription is covered. [facility].

## 2021-11-01 ENCOUNTER — Other Ambulatory Visit: Payer: Self-pay | Admitting: Family Medicine

## 2021-11-01 DIAGNOSIS — Z1231 Encounter for screening mammogram for malignant neoplasm of breast: Secondary | ICD-10-CM

## 2021-11-15 ENCOUNTER — Ambulatory Visit
Admission: RE | Admit: 2021-11-15 | Discharge: 2021-11-15 | Disposition: A | Discharge status: Home / Self Care | Payer: 59 | Source: Ambulatory Visit | Attending: Family Medicine | Admitting: Family Medicine

## 2021-11-15 DIAGNOSIS — Z1231 Encounter for screening mammogram for malignant neoplasm of breast: Secondary | ICD-10-CM

## 2022-01-22 ENCOUNTER — Other Ambulatory Visit: Payer: Self-pay | Admitting: Family Medicine

## 2022-01-22 DIAGNOSIS — E7800 Pure hypercholesterolemia, unspecified: Secondary | ICD-10-CM

## 2022-01-27 ENCOUNTER — Other Ambulatory Visit (INDEPENDENT_AMBULATORY_CARE_PROVIDER_SITE_OTHER): Payer: 59

## 2022-01-27 DIAGNOSIS — E7800 Pure hypercholesterolemia, unspecified: Secondary | ICD-10-CM

## 2022-01-27 LAB — COMPREHENSIVE METABOLIC PANEL
ALT: 10 U/L (ref 0–35)
AST: 16 U/L (ref 0–37)
Albumin: 4.2 g/dL (ref 3.5–5.2)
Alkaline Phosphatase: 54 U/L (ref 39–117)
BUN: 16 mg/dL (ref 6–23)
CO2: 30 mEq/L (ref 19–32)
Calcium: 9.4 mg/dL (ref 8.4–10.5)
Chloride: 104 mEq/L (ref 96–112)
Creatinine, Ser: 0.74 mg/dL (ref 0.40–1.20)
GFR: 88.58 mL/min (ref >60.00)
Glucose, Bld: 91 mg/dL (ref 70–99)
Potassium: 3.9 mEq/L (ref 3.5–5.1)
Sodium: 141 mEq/L (ref 135–145)
Total Bilirubin: 0.6 mg/dL (ref 0.2–1.2)
Total Protein: 7.3 g/dL (ref 6.0–8.3)

## 2022-01-27 LAB — LIPID PANEL
Cholesterol: 198 mg/dL (ref 0–200)
HDL: 57.8 mg/dL (ref >39.00)
LDL Cholesterol: 127 mg/dL — ABNORMAL HIGH (ref 0–99)
NonHDL: 140.45
Total CHOL/HDL Ratio: 3
Triglycerides: 66 mg/dL (ref 0.0–149.0)
VLDL: 13.2 mg/dL (ref 0.0–40.0)

## 2022-02-03 ENCOUNTER — Encounter: Payer: Self-pay | Admitting: Family Medicine

## 2022-02-03 ENCOUNTER — Ambulatory Visit (INDEPENDENT_AMBULATORY_CARE_PROVIDER_SITE_OTHER): Payer: 59 | Admitting: Family Medicine

## 2022-02-03 VITALS — BP 140/98 | HR 68 | Temp 97.2°F | Ht 65.0 in | Wt 163.8 lb

## 2022-02-03 DIAGNOSIS — R55 Syncope and collapse: Secondary | ICD-10-CM | POA: Insufficient documentation

## 2022-02-03 DIAGNOSIS — Z0000 Encounter for general adult medical examination without abnormal findings: Secondary | ICD-10-CM | POA: Diagnosis not present

## 2022-02-03 DIAGNOSIS — E7800 Pure hypercholesterolemia, unspecified: Secondary | ICD-10-CM

## 2022-02-03 DIAGNOSIS — I10 Essential (primary) hypertension: Secondary | ICD-10-CM | POA: Diagnosis not present

## 2022-02-03 HISTORY — DX: Syncope and collapse: R55

## 2022-02-03 MED ORDER — LISINOPRIL-HYDROCHLOROTHIAZIDE 10-12.5 MG PO TABS: 1.0000 | ORAL_TABLET | Freq: Every morning | ORAL | 4 refills | Status: DC

## 2022-02-03 NOTE — Assessment & Plan Note (Signed)
Chronic, BP elevated today - continue lisinopril/hctz, I did ask her to start monitoring BP at home and let me know if consistently >140/90 for med titration.

## 2022-02-03 NOTE — Assessment & Plan Note (Addendum)
Isolated episode after Thanksgiving in setting of several missed BP doses as well as possible dehydration - anticipate related to this. Denies hyper or hypothyroid symptoms, states Hgb normal every time she donates blood, latest 10/2021.  Update EKG today - normal.  Advised let us know if any recurrent syncope for further evaluation likely to include echocardiogram and holter monitor.

## 2022-02-03 NOTE — Assessment & Plan Note (Signed)
Chronic, stable off statin. Reviewed diet choices to improve cholesterol control. No fmhx CAD.  The 10-year ASCVD risk score (Arnett DK, et al., 2019) is: 4.6%   Values used to calculate the score:     Age: 61 years     Sex: Female     Is Non-Hispanic African American: No     Diabetic: No     Tobacco smoker: No     Systolic Blood Pressure: 456 mmHg     Is BP treated: Yes     HDL Cholesterol: 57.8 mg/dL     Total Cholesterol: 198 mg/dL

## 2022-02-03 NOTE — Progress Notes (Signed)
Patient ID: Stacey Bean, female    DOB: May 16, 1960, 61 y.o.   MRN: [id number]  This visit was conducted in person.  BP (!) 140/98   Pulse 68   Temp (!) 97.2 F (36.2 C) (Temporal)   Ht '5\' 5"'$  (1.651 m)   Wt 163 lb 12.8 oz (74.3 kg)   SpO2 98%   BMI 27.26 kg/m   BP elevated on retesting 158/100  CC: CPE Subjective:   HPI: Stacey Bean is a 61 y.o. female presenting on 02/03/2022 for Annual Exam   Syncope x1 around thanksgiving after missing 4 days of bp med, the day she restarted BP med she had syncopal episode that evening, thinks lasted a few minutes as she awoke on floor with broken glass with spilled water. Premonitory symptoms of dizziness/lightheadedness prior to passing out. No chest pain, shortness of breath. In setting of very busy preceding few days, likely dehydration, and missing several days of BP med.    Preventative: COLONOSCOPY Date: 11/2012 tubular adenoma, mod diverticulosis, int hem, rpt 5 yrs [facility]) COLONOSCOPY 12/2017 - HP, rpt 10 yrs ([facility])  Mammogram - 09/2020 abnormal calcifications to L breast s/p biopsy showing benign fibroadenoma with calcifications rec [facility]  [facility]2021 - [facility]  Well woman with OBGYN Dr. Phineas Real (retired) - every 3 years - new at [facility] for women.  VAGINAL HYSTERECTOMY Date: 2004 for fibroids, ovaries remain  Lung cancer screening - not eligible  Flu shot yearly at work  COVID vaccine J&J 04/2019, 11/2019  Td 2009, Tdap 2018 Shingrix - 09/2017, 12/2017 Seat belt use discussed.  Sunscreen use discussed. No changing moles. Sees derm yearly.  Sleep - averaging 8 hours/night No significant smoking history  Alcohol - a few glasses of wine on weekend  Dentist Q6 mo Eye exam yearly  Bowel - no constipation Bladder - no incontinence   Caffeine: 2 cups coffee Widowed, husband died at age 79 of colon cancer. No pets.  2 grown daughters, 1 granddaughter all in  [place] Occ: works for [employer] department Activity: walks regularly 4d/wk 2.5 mi (10k steps), gym 3x/wk  Diet: good water, daily fruits/vegetables, some lactose intolerance      Relevant past medical, surgical, family and social history reviewed and updated as indicated. Interim medical history since our last visit reviewed. Allergies and medications reviewed and updated. Outpatient Medications Prior to Visit  Medication Sig Dispense Refill   lisinopril-hydrochlorothiazide (ZESTORETIC) 10-12.5 MG tablet Take 1 tablet by mouth every morning. 90 tablet 3   fluorouracil (EFUDEX) 5 % cream Apply topically at bedtime. (Patient not taking: Reported on 02/03/2022) 40 g 0   No facility-administered medications prior to visit.     Per HPI unless specifically indicated in ROS section below Review of Systems  Constitutional:  Negative for activity change, appetite change, chills, fatigue, fever and unexpected weight change.  HENT:  Negative for hearing loss.   Eyes:  Negative for visual disturbance.  Respiratory:  Negative for cough, chest tightness, shortness of breath and wheezing.   Cardiovascular:  Negative for chest pain, palpitations and leg swelling.  Gastrointestinal:  Negative for abdominal distention, abdominal pain, blood in stool, constipation, diarrhea, nausea and vomiting.  Genitourinary:  Negative for difficulty urinating and hematuria.  Musculoskeletal:  Negative for arthralgias, myalgias and neck pain.  Skin:  Negative for rash.  Neurological:  Positive for syncope (see HPI). Negative for dizziness, seizures and headaches.  Hematological:  Negative  for adenopathy. Does not bruise/bleed easily.  Psychiatric/Behavioral:  Negative for dysphoric mood. The patient is not nervous/anxious.     Objective:  BP (!) 140/98   Pulse 68   Temp (!) 97.2 F (36.2 C) (Temporal)   Ht '5\' 5"'$  (1.651 m)   Wt 163 lb 12.8 oz (74.3 kg)   SpO2 98%   BMI 27.26 kg/m   Wt Readings from Last 3  Encounters:  02/03/22 163 lb 12.8 oz (74.3 kg)  02/02/21 162 lb 8 oz (73.7 kg)  02/02/20 160 lb 7 oz (72.8 kg)      Physical Exam Vitals and nursing note reviewed.  Constitutional:      Appearance: Normal appearance. She is not ill-appearing.  HENT:     Head: Normocephalic and atraumatic.     Right Ear: Tympanic membrane, ear canal and external ear normal. There is no impacted cerumen.     Left Ear: Tympanic membrane, ear canal and external ear normal. There is no impacted cerumen.     Mouth/Throat:     Comments: Wearing mask Eyes:     General:        Right eye: No discharge.        Left eye: No discharge.     Extraocular Movements: Extraocular movements intact.     Conjunctiva/sclera: Conjunctivae normal.     Pupils: Pupils are equal, round, and reactive to light.  Neck:     Thyroid: No thyroid mass or thyromegaly.     Vascular: No carotid bruit.  Cardiovascular:     Rate and Rhythm: Normal rate and regular rhythm.     Pulses: Normal pulses.     Heart sounds: Normal heart sounds. No murmur heard. Pulmonary:     Effort: Pulmonary effort is normal. No respiratory distress.     Breath sounds: Normal breath sounds. No wheezing, rhonchi or rales.  Abdominal:     General: Bowel sounds are normal. There is no distension.     Palpations: Abdomen is soft. There is no mass.     Tenderness: There is no abdominal tenderness. There is no guarding or rebound.     Hernia: No hernia is present.  Musculoskeletal:     Cervical back: Normal range of motion and neck supple. No rigidity.     Right lower leg: No edema.     Left lower leg: No edema.  Lymphadenopathy:     Cervical: No cervical adenopathy.  Skin:    General: Skin is warm and dry.     Findings: No rash.  Neurological:     General: No focal deficit present.     Mental Status: She is alert. Mental status is at baseline.  Psychiatric:        Mood and Affect: Mood normal.        Behavior: Behavior normal.       Results for  orders placed or performed in visit on 01/27/22  Comprehensive metabolic panel  Result Value Ref Range   Sodium 141 135 - 145 mEq/L   Potassium 3.9 3.5 - 5.1 mEq/L   Chloride 104 96 - 112 mEq/L   CO2 30 19 - 32 mEq/L   Glucose, Bld 91 70 - 99 mg/dL   BUN 16 6 - 23 mg/dL   Creatinine, Ser 0.74 0.40 - 1.20 mg/dL   Total Bilirubin 0.6 0.2 - 1.2 mg/dL   Alkaline Phosphatase 54 39 - 117 U/L   AST 16 0 - 37 U/L   ALT 10 0 -  35 U/L   Total Protein 7.3 6.0 - 8.3 g/dL   Albumin 4.2 3.5 - 5.2 g/dL   GFR 88.58 >60.00 mL/min   Calcium 9.4 8.4 - 10.5 mg/dL  Lipid panel  Result Value Ref Range   Cholesterol 198 0 - 200 mg/dL   Triglycerides 66.0 0.0 - 149.0 mg/dL   HDL 57.80 >39.00 mg/dL   VLDL 13.2 0.0 - 40.0 mg/dL   LDL Cholesterol 127 (H) 0 - 99 mg/dL   Total CHOL/HDL Ratio 3    NonHDL 140.45    Lab Results  Component Value Date   WBC 7.1 04/24/2019   HGB 13.9 04/24/2019   HCT 41.0 04/24/2019   MCV 87.2 04/24/2019   PLT 350.0 04/24/2019    Lab Results  Component Value Date   TSH 1.11 02/02/2021    EKG - NSR rate 60s, normal axis, intervals, no hypertrophy or acute ST/T changes  Assessment & Plan:   Problem List Items Addressed This Visit     Health maintenance examination - Primary (Chronic)    Preventative protocols reviewed and updated unless pt declined. Discussed healthy diet and lifestyle.       Essential hypertension    Chronic, BP elevated today - continue lisinopril/hctz, I did ask her to start monitoring BP at home and let me know if consistently >140/90 for med titration.       Relevant Medications   lisinopril-hydrochlorothiazide (ZESTORETIC) 10-12.5 MG tablet   Hyperlipidemia    Chronic, stable off statin. Reviewed diet choices to improve cholesterol control. No fmhx CAD.  The 10-year ASCVD risk score (Arnett DK, et al., 2019) is: 4.6%   Values used to calculate the score:     Age: 40 years     Sex: Female     Is Non-Hispanic African American: No      Diabetic: No     Tobacco smoker: No     Systolic Blood Pressure: 132 mmHg     Is BP treated: Yes     HDL Cholesterol: 57.8 mg/dL     Total Cholesterol: 198 mg/dL       Relevant Medications   lisinopril-hydrochlorothiazide (ZESTORETIC) 10-12.5 MG tablet   Syncope    Isolated episode after Thanksgiving in setting of several missed BP doses as well as possible dehydration - anticipate related to this. Denies hyper or hypothyroid symptoms, states Hgb normal every time she donates blood, latest 10/2021.  Update EKG today - normal.  Advised let us know if any recurrent syncope for further evaluation likely to include echocardiogram and holter monitor.       Relevant Orders   EKG 12-Lead (Completed)     Meds ordered this encounter  Medications   lisinopril-hydrochlorothiazide (ZESTORETIC) 10-12.5 MG tablet    Sig: Take 1 tablet by mouth every morning.    Dispense:  90 tablet    Refill:  4   Orders Placed This Encounter  Procedures   EKG 12-Lead    Patient instructions: EKG today BP was too high today - start monitoring blood pressures at home, let me know if consistently >140/90 for med management.  Let me know if any recurrent passing out occurs.  Return as needed or in 1 year for next physical.   Follow up plan: Return in about 1 year (around 02/04/2023) for annual exam, prior fasting for blood work.  Ria Bush, MD

## 2022-02-03 NOTE — Patient Instructions (Addendum)
EKG today BP was too high today - start monitoring blood pressures at home, let me know if consistently >140/90 for med management.  Let me know if any recurrent passing out occurs.  Return as needed or in 1 year for next physical.   Health Maintenance for Postmenopausal Women Menopause is a normal process in which your ability to get pregnant comes to an end. This process happens slowly over many months or years, usually between the ages of 64 and 65. Menopause is complete when you have missed your menstrual period for 12 months. It is important to talk with your health care provider about some of the most common conditions that affect women after menopause (postmenopausal women). These include heart disease, cancer, and bone loss (osteoporosis). Adopting a healthy lifestyle and getting preventive care can help to promote your health and wellness. The actions you take can also lower your chances of developing some of these common conditions. What are the signs and symptoms of menopause? During menopause, you may have the following symptoms: Hot flashes. These can be moderate or severe. Night sweats. Decrease in sex drive. Mood swings. Headaches. Tiredness (fatigue). Irritability. Memory problems. Problems falling asleep or staying asleep. Talk with your health care provider about treatment options for your symptoms. Do I need hormone replacement therapy? Hormone replacement therapy is effective in treating symptoms that are caused by menopause, such as hot flashes and night sweats. Hormone replacement carries certain risks, especially as you become older. If you are thinking about using estrogen or estrogen with progestin, discuss the benefits and risks with your health care provider. How can I reduce my risk for heart disease and stroke? The risk of heart disease, heart attack, and stroke increases as you age. One of the causes may be a change in the body's hormones during menopause. This  can affect how your body uses dietary fats, triglycerides, and cholesterol. Heart attack and stroke are medical emergencies. There are many things that you can do to help prevent heart disease and stroke. Watch your blood pressure High blood pressure causes heart disease and increases the risk of stroke. This is more likely to develop in people who have high blood pressure readings or are overweight. Have your blood pressure checked: Every 3-5 years if you are 45-50 years of age. Every year if you are 66 years old or older. Eat a healthy diet  Eat a diet that includes plenty of vegetables, fruits, low-fat dairy products, and lean protein. Do not eat a lot of foods that are high in solid fats, added sugars, or sodium. Get regular exercise Get regular exercise. This is one of the most important things you can do for your health. Most adults should: Try to exercise for at least 150 minutes each week. The exercise should increase your heart rate and make you sweat (moderate-intensity exercise). Try to do strengthening exercises at least twice each week. Do these in addition to the moderate-intensity exercise. Spend less time sitting. Even light physical activity can be beneficial. Other tips Work with your health care provider to achieve or maintain a healthy weight. Do not use any products that contain nicotine or tobacco. These products include cigarettes, chewing tobacco, and vaping devices, such as e-cigarettes. If you need help quitting, ask your health care provider. Know your numbers. Ask your health care provider to check your cholesterol and your blood sugar (glucose). Continue to have your blood tested as directed by your health care provider. Do I need screening for  cancer? Depending on your health history and family history, you may need to have cancer screenings at different stages of your life. This may include screening for: Breast cancer. Cervical cancer. Lung cancer. Colorectal  cancer. What is my risk for osteoporosis? After menopause, you may be at increased risk for osteoporosis. Osteoporosis is a condition in which bone destruction happens more quickly than new bone creation. To help prevent osteoporosis or the bone fractures that can happen because of osteoporosis, you may take the following actions: If you are 23-80 years old, get at least 1,000 mg of calcium and at least 600 international units (IU) of vitamin D per day. If you are older than age 64 but younger than age 29, get at least 1,200 mg of calcium and at least 600 international units (IU) of vitamin D per day. If you are older than age 74, get at least 1,200 mg of calcium and at least 800 international units (IU) of vitamin D per day. Smoking and drinking excessive alcohol increase the risk of osteoporosis. Eat foods that are rich in calcium and vitamin D, and do weight-bearing exercises several times each week as directed by your health care provider. How does menopause affect my mental health? Depression may occur at any age, but it is more common as you become older. Common symptoms of depression include: Feeling depressed. Changes in sleep patterns. Changes in appetite or eating patterns. Feeling an overall lack of motivation or enjoyment of activities that you previously enjoyed. Frequent crying spells. Talk with your health care provider if you think that you are experiencing any of these symptoms. General instructions See your health care provider for regular wellness exams and vaccines. This may include: Scheduling regular health, dental, and eye exams. Getting and maintaining your vaccines. These include: Influenza vaccine. Get this vaccine each year before the flu season begins. Pneumonia vaccine. Shingles vaccine. Tetanus, diphtheria, and pertussis (Tdap) booster vaccine. Your health care provider may also recommend other immunizations. Tell your health care provider if you have ever been  abused or do not feel safe at home. Summary Menopause is a normal process in which your ability to get pregnant comes to an end. This condition causes hot flashes, night sweats, decreased interest in sex, mood swings, headaches, or lack of sleep. Treatment for this condition may include hormone replacement therapy. Take actions to keep yourself healthy, including exercising regularly, eating a healthy diet, watching your weight, and checking your blood pressure and blood sugar levels. Get screened for cancer and depression. Make sure that you are up to date with all your vaccines. This information is not intended to replace advice given to you by your health care provider. Make sure you discuss any questions you have with your health care provider. Document Revised: 06/28/2020 Document Reviewed: 06/28/2020 Elsevier Patient Education  [place].

## 2022-02-03 NOTE — Assessment & Plan Note (Signed)
Preventative protocols reviewed and updated unless pt declined. Discussed healthy diet and lifestyle.  

## 2022-02-06 ENCOUNTER — Encounter: Payer: Self-pay | Admitting: Family Medicine

## 2022-02-08 MED ORDER — LISINOPRIL-HYDROCHLOROTHIAZIDE 20-12.5 MG PO TABS: 1.0000 | ORAL_TABLET | Freq: Every day | ORAL | 4 refills | Status: DC

## 2022-09-22 ENCOUNTER — Ambulatory Visit: Payer: 59 | Admitting: Podiatry

## 2022-09-22 ENCOUNTER — Encounter: Payer: Self-pay | Admitting: Podiatry

## 2022-09-22 ENCOUNTER — Ambulatory Visit: Payer: 59

## 2022-09-22 DIAGNOSIS — M2011 Hallux valgus (acquired), right foot: Secondary | ICD-10-CM

## 2022-09-22 DIAGNOSIS — M2012 Hallux valgus (acquired), left foot: Secondary | ICD-10-CM | POA: Diagnosis not present

## 2022-09-25 NOTE — Progress Notes (Signed)
Subjective:   Patient ID: Stacey Bean, female   DOB: 62 y.o.   MRN: [id number]   HPI Patient presents with structural bunions on both feet of years duration and history of fusion of the left subtalar joint ankle joint and that has thrown her date off overall.  Patient does not smoke tries to be active   Review of Systems  All other systems reviewed and are negative.       Objective:  Physical Exam Vitals and nursing note reviewed.  Constitutional:      Appearance: She is well-developed.  Pulmonary:     Effort: Pulmonary effort is normal.  Musculoskeletal:        General: Normal range of motion.  Skin:    General: Skin is warm.  Neurological:     Mental Status: She is alert.     Neurovascular status intact muscle strength found to be adequate range of motion adequate right with patient noted to have moderate structural bunion deformity bilateral hyperostosis noted mild redness no active other pathology noted no movement of the subtalar midtarsal joint left.  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  At this point we discussed bunions and I do think at 1 point this will need to be corrected but I would not recommend currently due to the mild discomfort and ability to wear shoe gear comfortably.  Reviewed what would be required for distal osteotomies  X-rays indicate elevation of the intermetatarsal angle of about 13 degrees bilateral mild hyperostosis first metatarsal multiple signs with stable fixation of fusion of the left subtalar and ankle joint

## 2023-01-04 ENCOUNTER — Other Ambulatory Visit: Payer: Self-pay | Admitting: Family Medicine

## 2023-01-04 DIAGNOSIS — Z1231 Encounter for screening mammogram for malignant neoplasm of breast: Secondary | ICD-10-CM

## 2023-01-25 ENCOUNTER — Other Ambulatory Visit: Payer: Self-pay | Admitting: Family Medicine

## 2023-01-25 DIAGNOSIS — R55 Syncope and collapse: Secondary | ICD-10-CM

## 2023-01-25 DIAGNOSIS — E7800 Pure hypercholesterolemia, unspecified: Secondary | ICD-10-CM

## 2023-01-30 ENCOUNTER — Other Ambulatory Visit (INDEPENDENT_AMBULATORY_CARE_PROVIDER_SITE_OTHER): Payer: 59

## 2023-01-30 DIAGNOSIS — E7800 Pure hypercholesterolemia, unspecified: Secondary | ICD-10-CM

## 2023-01-30 DIAGNOSIS — R55 Syncope and collapse: Secondary | ICD-10-CM

## 2023-01-30 LAB — LIPID PANEL
Cholesterol: 189 mg/dL (ref 0–200)
HDL: 55.4 mg/dL (ref >39.00)
LDL Cholesterol: 120 mg/dL — ABNORMAL HIGH (ref 0–99)
NonHDL: 133.19
Total CHOL/HDL Ratio: 3
Triglycerides: 65 mg/dL (ref 0.0–149.0)
VLDL: 13 mg/dL (ref 0.0–40.0)

## 2023-01-30 LAB — COMPREHENSIVE METABOLIC PANEL
ALT: 8 U/L (ref 0–35)
AST: 16 U/L (ref 0–37)
Albumin: 4.1 g/dL (ref 3.5–5.2)
Alkaline Phosphatase: 56 U/L (ref 39–117)
BUN: 17 mg/dL (ref 6–23)
CO2: 31 meq/L (ref 19–32)
Calcium: 9.2 mg/dL (ref 8.4–10.5)
Chloride: 103 meq/L (ref 96–112)
Creatinine, Ser: 0.77 mg/dL (ref 0.40–1.20)
GFR: 83.86 mL/min (ref >60.00)
Glucose, Bld: 88 mg/dL (ref 70–99)
Potassium: 4 meq/L (ref 3.5–5.1)
Sodium: 141 meq/L (ref 135–145)
Total Bilirubin: 0.4 mg/dL (ref 0.2–1.2)
Total Protein: 6.8 g/dL (ref 6.0–8.3)

## 2023-01-30 LAB — CBC WITH DIFFERENTIAL/PLATELET
Basophils Absolute: 0.1 10*3/uL (ref 0.0–0.1)
Basophils Relative: 1.3 % (ref 0.0–3.0)
Eosinophils Absolute: 0.2 10*3/uL (ref 0.0–0.7)
Eosinophils Relative: 4.1 % (ref 0.0–5.0)
HCT: 39.8 % (ref 36.0–46.0)
Hemoglobin: 13.2 g/dL (ref 12.0–15.0)
Lymphocytes Relative: 29.6 % (ref 12.0–46.0)
Lymphs Abs: 1.2 10*3/uL (ref 0.7–4.0)
MCHC: 33.1 g/dL (ref 30.0–36.0)
MCV: 89.2 fL (ref 78.0–100.0)
Monocytes Absolute: 0.4 10*3/uL (ref 0.1–1.0)
Monocytes Relative: 10.5 % (ref 3.0–12.0)
Neutro Abs: 2.3 10*3/uL (ref 1.4–7.7)
Neutrophils Relative %: 54.5 % (ref 43.0–77.0)
Platelets: 352 10*3/uL (ref 150.0–400.0)
RBC: 4.46 Mil/uL (ref 3.87–5.11)
RDW: 12.3 % (ref 11.5–15.5)
WBC: 4.1 10*3/uL (ref 4.0–10.5)

## 2023-01-30 LAB — TSH: TSH: 1.09 u[IU]/mL (ref 0.35–5.50)

## 2023-01-31 ENCOUNTER — Ambulatory Visit
Admission: RE | Admit: 2023-01-31 | Discharge: 2023-01-31 | Disposition: A | Discharge status: Home / Self Care | Payer: 59 | Source: Ambulatory Visit | Attending: Family Medicine | Admitting: Family Medicine

## 2023-01-31 DIAGNOSIS — Z1231 Encounter for screening mammogram for malignant neoplasm of breast: Secondary | ICD-10-CM

## 2023-02-06 ENCOUNTER — Ambulatory Visit (INDEPENDENT_AMBULATORY_CARE_PROVIDER_SITE_OTHER): Payer: 59 | Admitting: Family Medicine

## 2023-02-06 ENCOUNTER — Encounter: Payer: Self-pay | Admitting: Family Medicine

## 2023-02-06 VITALS — BP 118/74 | HR 73 | Temp 98.2°F | Ht 65.25 in | Wt 163.2 lb

## 2023-02-06 DIAGNOSIS — Z0000 Encounter for general adult medical examination without abnormal findings: Secondary | ICD-10-CM | POA: Diagnosis not present

## 2023-02-06 DIAGNOSIS — I10 Essential (primary) hypertension: Secondary | ICD-10-CM | POA: Diagnosis not present

## 2023-02-06 DIAGNOSIS — E7800 Pure hypercholesterolemia, unspecified: Secondary | ICD-10-CM

## 2023-02-06 MED ORDER — LISINOPRIL-HYDROCHLOROTHIAZIDE 20-12.5 MG PO TABS: 1.0000 | ORAL_TABLET | Freq: Every day | ORAL | 4 refills | Status: DC

## 2023-02-06 NOTE — Patient Instructions (Signed)
You are doing well today Continue current medicine. Return as needed or in 1 year for next physical

## 2023-02-06 NOTE — Assessment & Plan Note (Addendum)
Chronic, off statin.  Reviewed ASCVD score.  Discussed given strong fmhx CVA, will check lipoprotein a next labs given fmhx. The 10-year ASCVD risk score (Arnett DK, et al., 2019) is: 3.6%   Values used to calculate the score:     Age: 62 years     Sex: Female     Is Non-Hispanic African American: No     Diabetic: No     Tobacco smoker: No     Systolic Blood Pressure: 118 mmHg     Is BP treated: Yes     HDL Cholesterol: 55.4 mg/dL     Total Cholesterol: 189 mg/dL

## 2023-02-06 NOTE — Progress Notes (Signed)
Ph: [phone] Fax: [phone]   Patient ID: Stacey Bean, female    DOB: 24-Apr-1960, 62 y.o.   MRN: [id number]  This visit was conducted in person.  BP 118/74   Pulse 73   Temp 98.2 F (36.8 C) (Oral)   Ht 5' 5.25" (1.657 m)   Wt 163 lb 4 oz (74 kg)   SpO2 97%   BMI 26.96 kg/m    CC: CPE Subjective:   HPI: Stacey Bean is a 62 y.o. 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  HTN - tolerating medication well.  Preventative: COLONOSCOPY Date: 11/2012 tubular adenoma, mod diverticulosis, int hem, rpt 5 yrs Arlyce Dice) COLONOSCOPY 12/2017 - HP, rpt 10 yrs (Danis)  Well woman with OBGYN Dr. Audie Box (retired) - sees Q3 years at [facility].  VAGINAL HYSTERECTOMY 2004 for fibroids, ovaries remain  [facility]  Lung cancer screening - not eligible  Flu shot yearly at work  COVID vaccine J&J 04/2019, 11/2019  Td 2009, Tdap 2018 Shingrix - 09/2017, 12/2017 Seat belt use discussed.  Sunscreen use discussed. No changing moles. Sees derm yearly at [facility].  Sleep - averaging 8 hours/night No significant smoking history  Alcohol - a few glasses of wine on weekend  Dentist Q6 mo Eye exam yearly  Bowel - no constipation Bladder - no incontinence    Caffeine: 2 cups coffee Widowed, husband died at age 53 of colon cancer. No pets.  2 grown daughters, 1 granddaughter all in [place] Occ: works for [employer] department  Activity: walks regularly 4d/wk 2.5 mi (10k steps), gym 3x/wk  Diet: good water, daily fruits/vegetables, some lactose intolerance      Relevant past medical, surgical, family and social history reviewed and updated as indicated. Interim medical history since our last visit reviewed. Allergies and medications reviewed and updated. Outpatient Medications Prior to Visit  Medication Sig Dispense Refill   lisinopril-hydrochlorothiazide  (ZESTORETIC) 20-12.5 MG tablet Take 1 tablet by mouth daily. 90 tablet 4   No facility-administered medications prior to visit.     Per HPI unless specifically indicated in ROS section below Review of Systems  Constitutional:  Negative for activity change, appetite change, chills, fatigue, fever and unexpected weight change.  HENT:  Negative for hearing loss.   Eyes:  Negative for visual disturbance.  Respiratory:  Negative for cough, chest tightness, shortness of breath and wheezing.   Cardiovascular:  Negative for chest pain, palpitations and leg swelling.  Gastrointestinal:  Negative for abdominal distention, abdominal pain, blood in stool, constipation, diarrhea, nausea and vomiting.  Genitourinary:  Negative for difficulty urinating and hematuria.  Musculoskeletal:  Negative for arthralgias, myalgias and neck pain.  Skin:  Negative for rash.  Neurological:  Negative for dizziness, seizures, syncope and headaches.  Hematological:  Negative for adenopathy. Does not bruise/bleed easily.  Psychiatric/Behavioral:  Negative for dysphoric mood. The patient is not nervous/anxious.     Objective:  BP 118/74   Pulse 73   Temp 98.2 F (36.8 C) (Oral)   Ht 5' 5.25" (1.657 m)   Wt 163 lb 4 oz (74 kg)   SpO2 97%   BMI 26.96 kg/m   Wt Readings from Last 3 Encounters:  02/06/23 163 lb 4 oz (74 kg)  02/03/22 163 lb 12.8 oz (74.3 kg)  02/02/21 162 lb 8 oz (73.7 kg)      Physical Exam  Vitals and nursing note reviewed.  Constitutional:      Appearance: Normal appearance. She is not ill-appearing.  HENT:     Head: Normocephalic and atraumatic.     Right Ear: Tympanic membrane, ear canal and external ear normal. There is no impacted cerumen.     Left Ear: Tympanic membrane, ear canal and external ear normal. There is no impacted cerumen.     Mouth/Throat:     Mouth: Mucous membranes are moist.     Pharynx: Oropharynx is clear. No oropharyngeal exudate or posterior oropharyngeal  erythema.  Eyes:     General:        Right eye: No discharge.        Left eye: No discharge.     Extraocular Movements: Extraocular movements intact.     Conjunctiva/sclera: Conjunctivae normal.     Pupils: Pupils are equal, round, and reactive to light.  Neck:     Thyroid: No thyroid mass or thyromegaly.     Vascular: No carotid bruit.  Cardiovascular:     Rate and Rhythm: Normal rate and regular rhythm.     Pulses: Normal pulses.     Heart sounds: Normal heart sounds. No murmur heard. Pulmonary:     Effort: Pulmonary effort is normal. No respiratory distress.     Breath sounds: Normal breath sounds. No wheezing, rhonchi or rales.  Abdominal:     General: Bowel sounds are normal. There is no distension.     Palpations: Abdomen is soft. There is no mass.     Tenderness: There is no abdominal tenderness. There is no guarding or rebound.     Hernia: No hernia is present.  Musculoskeletal:     Cervical back: Normal range of motion and neck supple. No rigidity.     Right lower leg: No edema.     Left lower leg: No edema.  Lymphadenopathy:     Cervical: No cervical adenopathy.  Skin:    General: Skin is warm and dry.     Findings: No rash.  Neurological:     General: No focal deficit present.     Mental Status: She is alert. Mental status is at baseline.  Psychiatric:        Mood and Affect: Mood normal.        Behavior: Behavior normal.       Results for orders placed or performed in visit on 01/30/23  CBC with Differential/Platelet   Collection Time: 01/30/23  8:03 AM  Result Value Ref Range   WBC 4.1 4.0 - 10.5 K/uL   RBC 4.46 3.87 - 5.11 Mil/uL   Hemoglobin 13.2 12.0 - 15.0 g/dL   HCT 78.4 69.6 - 29.5 %   MCV 89.2 78.0 - 100.0 fl   MCHC 33.1 30.0 - 36.0 g/dL   RDW 28.4 13.2 - 44.0 %   Platelets 352.0 150.0 - 400.0 K/uL   Neutrophils Relative % 54.5 43.0 - 77.0 %   Lymphocytes Relative 29.6 12.0 - 46.0 %   Monocytes Relative 10.5 3.0 - 12.0 %   Eosinophils  Relative 4.1 0.0 - 5.0 %   Basophils Relative 1.3 0.0 - 3.0 %   Neutro Abs 2.3 1.4 - 7.7 K/uL   Lymphs Abs 1.2 0.7 - 4.0 K/uL   Monocytes Absolute 0.4 0.1 - 1.0 K/uL   Eosinophils Absolute 0.2 0.0 - 0.7 K/uL   Basophils Absolute 0.1 0.0 - 0.1 K/uL  TSH   Collection Time: 01/30/23  8:03 AM  Result  Value Ref Range   TSH 1.09 0.35 - 5.50 uIU/mL  Comprehensive metabolic panel   Collection Time: 01/30/23  8:03 AM  Result Value Ref Range   Sodium 141 135 - 145 mEq/L   Potassium 4.0 3.5 - 5.1 mEq/L   Chloride 103 96 - 112 mEq/L   CO2 31 19 - 32 mEq/L   Glucose, Bld 88 70 - 99 mg/dL   BUN 17 6 - 23 mg/dL   Creatinine, Ser 1.19 0.40 - 1.20 mg/dL   Total Bilirubin 0.4 0.2 - 1.2 mg/dL   Alkaline Phosphatase 56 39 - 117 U/L   AST 16 0 - 37 U/L   ALT 8 0 - 35 U/L   Total Protein 6.8 6.0 - 8.3 g/dL   Albumin 4.1 3.5 - 5.2 g/dL   GFR 14.78 >29.56 mL/min   Calcium 9.2 8.4 - 10.5 mg/dL  Lipid panel   Collection Time: 01/30/23  8:03 AM  Result Value Ref Range   Cholesterol 189 0 - 200 mg/dL   Triglycerides 21.3 0.0 - 149.0 mg/dL   HDL 08.65 >78.46 mg/dL   VLDL 96.2 0.0 - 95.2 mg/dL   LDL Cholesterol 841 (H) 0 - 99 mg/dL   Total CHOL/HDL Ratio 3    NonHDL 133.19     Assessment & Plan:   Problem List Items Addressed This Visit     Health maintenance examination - Primary (Chronic)   Preventative protocols reviewed and updated unless pt declined. Discussed healthy diet and lifestyle.       Essential hypertension   Chronic, stable on current regimen - continue.       Relevant Medications   lisinopril-hydrochlorothiazide (ZESTORETIC) 20-12.5 MG tablet   Hyperlipidemia   Chronic, off statin.  Reviewed ASCVD score.  Discussed given strong fmhx CVA, will check lipoprotein a next labs given fmhx. The 10-year ASCVD risk score (Arnett DK, et al., 2019) is: 3.6%   Values used to calculate the score:     Age: 38 years     Sex: Female     Is Non-Hispanic African American: No      Diabetic: No     Tobacco smoker: No     Systolic Blood Pressure: 118 mmHg     Is BP treated: Yes     HDL Cholesterol: 55.4 mg/dL     Total Cholesterol: 189 mg/dL       Relevant Medications   lisinopril-hydrochlorothiazide (ZESTORETIC) 20-12.5 MG tablet     Meds ordered this encounter  Medications   lisinopril-hydrochlorothiazide (ZESTORETIC) 20-12.5 MG tablet    Sig: Take 1 tablet by mouth daily.    Dispense:  90 tablet    Refill:  4    No orders of the defined types were placed in this encounter.   Patient Instructions  You are doing well today Continue current medicine. Return as needed or in 1 year for next physical  Follow up plan: Return in about 1 year (around 02/06/2024) for annual exam, prior fasting for blood work.  Eustaquio Boyden, MD

## 2023-02-06 NOTE — Assessment & Plan Note (Signed)
Chronic, stable on current regimen - continue. 

## 2023-02-06 NOTE — Assessment & Plan Note (Signed)
Preventative protocols reviewed and updated unless pt declined. Discussed healthy diet and lifestyle.  

## 2023-12-19 ENCOUNTER — Other Ambulatory Visit: Payer: Self-pay | Admitting: Family Medicine

## 2023-12-19 DIAGNOSIS — Z1231 Encounter for screening mammogram for malignant neoplasm of breast: Secondary | ICD-10-CM

## 2024-01-28 ENCOUNTER — Other Ambulatory Visit: Payer: Self-pay | Admitting: Family Medicine

## 2024-01-28 DIAGNOSIS — E7800 Pure hypercholesterolemia, unspecified: Secondary | ICD-10-CM

## 2024-02-01 ENCOUNTER — Other Ambulatory Visit (INDEPENDENT_AMBULATORY_CARE_PROVIDER_SITE_OTHER): Payer: 59

## 2024-02-01 DIAGNOSIS — E7800 Pure hypercholesterolemia, unspecified: Secondary | ICD-10-CM

## 2024-02-01 LAB — BASIC METABOLIC PANEL WITH GFR
BUN: 16 mg/dL (ref 6–23)
CO2: 31 meq/L (ref 19–32)
Calcium: 9.1 mg/dL (ref 8.4–10.5)
Chloride: 104 meq/L (ref 96–112)
Creatinine, Ser: 0.74 mg/dL (ref 0.40–1.20)
GFR: 87.34 mL/min (ref >60.00)
Glucose, Bld: 95 mg/dL (ref 70–99)
Potassium: 4.1 meq/L (ref 3.5–5.1)
Sodium: 141 meq/L (ref 135–145)

## 2024-02-01 LAB — LIPID PANEL
Cholesterol: 210 mg/dL — ABNORMAL HIGH (ref 0–200)
HDL: 58.2 mg/dL (ref >39.00)
LDL Cholesterol: 133 mg/dL — ABNORMAL HIGH (ref 0–99)
NonHDL: 151.67
Total CHOL/HDL Ratio: 4
Triglycerides: 95 mg/dL (ref 0.0–149.0)
VLDL: 19 mg/dL (ref 0.0–40.0)

## 2024-02-02 ENCOUNTER — Ambulatory Visit: Payer: Self-pay | Admitting: Family Medicine

## 2024-02-04 ENCOUNTER — Ambulatory Visit

## 2024-02-08 ENCOUNTER — Other Ambulatory Visit: Payer: 59

## 2024-02-08 ENCOUNTER — Encounter: Payer: 59 | Admitting: Family Medicine

## 2024-02-19 ENCOUNTER — Encounter: Admitting: Family Medicine

## 2024-02-19 ENCOUNTER — Encounter: Payer: Self-pay | Admitting: Family Medicine

## 2024-02-19 VITALS — BP 110/76 | HR 70 | Temp 97.8°F | Ht 65.25 in | Wt 167.8 lb

## 2024-02-19 DIAGNOSIS — E7800 Pure hypercholesterolemia, unspecified: Secondary | ICD-10-CM

## 2024-02-19 DIAGNOSIS — K5900 Constipation, unspecified: Secondary | ICD-10-CM

## 2024-02-19 DIAGNOSIS — Z0000 Encounter for general adult medical examination without abnormal findings: Secondary | ICD-10-CM

## 2024-02-19 DIAGNOSIS — I10 Essential (primary) hypertension: Secondary | ICD-10-CM | POA: Diagnosis not present

## 2024-02-19 DIAGNOSIS — Z23 Encounter for immunization: Secondary | ICD-10-CM

## 2024-02-19 DIAGNOSIS — K625 Hemorrhage of anus and rectum: Secondary | ICD-10-CM | POA: Insufficient documentation

## 2024-02-19 MED ORDER — LISINOPRIL-HYDROCHLOROTHIAZIDE 20-12.5 MG PO TABS: 1.0000 | ORAL_TABLET | Freq: Every day | ORAL | 3 refills | Status: AC

## 2024-02-19 NOTE — Assessment & Plan Note (Signed)
 Chronic, mild off statin.  Consider Lp(a) given strong fmhx stroke (both parents)  The 10-year ASCVD risk score (Arnett DK, et al., 2019) is: 3.6%   Values used to calculate the score:     Age: 63 years     Clinically relevant sex: Female     Is Non-Hispanic African American: No     Diabetic: No     Tobacco smoker: No     Systolic Blood Pressure: 110 mmHg     Is BP treated: Yes     HDL Cholesterol: 58.2 mg/dL     Total Cholesterol: 210 mg/dL

## 2024-02-19 NOTE — Assessment & Plan Note (Signed)
 Intermittent - continue dulcolax/miralax PRN, discussed dietary fibera nd water intake, discussed soluble fiber supplement.

## 2024-02-19 NOTE — Progress Notes (Signed)
 " Ph: [phone] Fax: [phone]   Patient ID: Stacey Bean Clubs, female    DOB: 05/01/1960, 63 y.o.   MRN: [id number]  This visit was conducted in person.  BP 110/76 (BP Location: Left Arm, Patient Position: Sitting, Cuff Size: Normal)   Pulse 70   Temp 97.8 F (36.6 C) (Oral)   Ht 5' 5.25 (1.657 m)   Wt 167 lb 12.8 oz (76.1 kg)   SpO2 99%   BMI 27.71 kg/m   BP Readings from Last 3 Encounters:  02/19/24 110/76  02/06/23 118/74  02/03/22 (!) 140/98   CC: CPE Subjective:   HPI: Stacey Bean is a 63 y.o. female presenting on 02/19/2024 for No chief complaint on file.   Retired at end of 2024.   Around October developed constipation - took dulcolax - had BRBPR with dripping followed by spotting. Treated with prep H OTC with benefit. No recurrent bleed.  Chronic constipation - manages with PRN miralax.  Feels overall good fiber intake.   Moh's surgery for [facility] to face   Preventative: COLONOSCOPY Date: 11/2012 tubular adenoma, mod diverticulosis, int hem, rpt 5 yrs [facility]) COLONOSCOPY 12/2017 - HP, rpt 10 yrs ([facility])  Well woman with OBGYN Dr. Rockney (retired) --> now sees [facility] - sees Q3-5 years last seen 2023.  VAGINAL HYSTERECTOMY 2004 for fibroids, ovaries remain  Mammogram [facility]/2023 - [facility] - rpt scheduled 02/2024 Lung cancer screening - not eligible  Flu shot yearly at work  COVID vaccine J&J 04/2019, 11/2019  Prevnar-20 - today  Td 2009, Tdap 2018 Shingrix - 09/2017, 12/2017 Seat belt use discussed.  Sunscreen use discussed. No changing moles. Sees derm q10mo at [facility] dermatology.  Sleep - averaging 8 hours/night No significant smoking history  Alcohol - a few glasses of wine on weekend  Dentist Q6 mo Eye exam yearly  Bowel - chronic mild constipation managed with miralax PRN Bladder - no incontinence    Caffeine: 2 cups coffee Widowed, husband died at age 33 of colon cancer. No pets.  2 grown daughters, 1  granddaughter all in [place] Occ: works for [employer]  Activity: walks regularly 4d/wk, light weights, has access to  gym 3x/wk  Diet: good water, daily fruits/vegetables, some lactose intolerance      Relevant past medical, surgical, family and social history reviewed and updated as indicated. Interim medical history since our last visit reviewed. Allergies and medications reviewed and updated. Outpatient Medications Prior to Visit  Medication Sig Dispense Refill   mupirocin ointment (BACTROBAN) 2 % Apply 1 Application topically 2 (two) times daily.     lisinopril -hydrochlorothiazide  (ZESTORETIC ) 20-12.5 MG tablet Take 1 tablet by mouth daily. 90 tablet 4   No facility-administered medications prior to visit.     Per HPI unless specifically indicated in ROS section below Review of Systems  Constitutional:  Negative for activity change, appetite change, chills, fatigue, fever and unexpected weight change.  HENT:  Negative for hearing loss.   Eyes:  Negative for visual disturbance.  Respiratory:  Negative for cough, chest tightness, shortness of breath and wheezing.   Cardiovascular:  Negative for chest pain, palpitations and leg swelling.  Gastrointestinal:  Positive for blood in stool and constipation (intermittent). Negative for abdominal distention, abdominal pain, diarrhea, nausea and vomiting.  Genitourinary:  Negative for difficulty urinating and hematuria.  Musculoskeletal:  Negative for arthralgias, myalgias and neck pain.  Skin:  Negative for rash.  Neurological:  Negative for dizziness, seizures,  syncope and headaches.  Hematological:  Negative for adenopathy. Does not bruise/bleed easily.  Psychiatric/Behavioral:  Negative for dysphoric mood. The patient is not nervous/anxious.     Objective:  BP 110/76 (BP Location: Left Arm, Patient Position: Sitting, Cuff Size: Normal)   Pulse 70   Temp 97.8 F (36.6 C) (Oral)   Ht 5' 5.25 (1.657 m)   Wt 167 lb 12.8 oz (76.1  kg)   SpO2 99%   BMI 27.71 kg/m   Wt Readings from Last 3 Encounters:  02/19/24 167 lb 12.8 oz (76.1 kg)  02/06/23 163 lb 4 oz (74 kg)  02/03/22 163 lb 12.8 oz (74.3 kg)      Physical Exam Vitals and nursing note reviewed.  Constitutional:      Appearance: Normal appearance. She is not ill-appearing.  HENT:     Head: Normocephalic and atraumatic.     Right Ear: Tympanic membrane, ear canal and external ear normal. There is no impacted cerumen.     Left Ear: Tympanic membrane, ear canal and external ear normal. There is no impacted cerumen.     Mouth/Throat:     Mouth: Mucous membranes are moist.     Pharynx: Oropharynx is clear. No oropharyngeal exudate or posterior oropharyngeal erythema.  Eyes:     General:        Right eye: No discharge.        Left eye: No discharge.     Extraocular Movements: Extraocular movements intact.     Conjunctiva/sclera: Conjunctivae normal.     Pupils: Pupils are equal, round, and reactive to light.  Neck:     Thyroid : No thyroid  mass or thyromegaly.  Cardiovascular:     Rate and Rhythm: Normal rate and regular rhythm.     Pulses: Normal pulses.     Heart sounds: Normal heart sounds. No murmur heard. Pulmonary:     Effort: Pulmonary effort is normal. No respiratory distress.     Breath sounds: Normal breath sounds. No wheezing, rhonchi or rales.  Abdominal:     General: Bowel sounds are normal. There is no distension.     Palpations: Abdomen is soft. There is no mass.     Tenderness: There is no abdominal tenderness. There is no guarding or rebound.     Hernia: No hernia is present.  Musculoskeletal:     Cervical back: Normal range of motion and neck supple. No rigidity.     Right lower leg: No edema.     Left lower leg: No edema.  Lymphadenopathy:     Cervical: No cervical adenopathy.  Skin:    General: Skin is warm and dry.     Findings: No rash.  Neurological:     General: No focal deficit present.     Mental Status: She is alert.  Mental status is at baseline.  Psychiatric:        Mood and Affect: Mood normal.        Behavior: Behavior normal.       Results for orders placed or performed in visit on 02/01/24  Basic metabolic panel with GFR   Collection Time: 02/01/24  7:45 AM  Result Value Ref Range   Sodium 141 135 - 145 mEq/L   Potassium 4.1 3.5 - 5.1 mEq/L   Chloride 104 96 - 112 mEq/L   CO2 31 19 - 32 mEq/L   Glucose, Bld 95 70 - 99 mg/dL   BUN 16 6 - 23 mg/dL   Creatinine, Ser 9.25  0.40 - 1.20 mg/dL   GFR 12.65 >39.99 mL/min   Calcium 9.1 8.4 - 10.5 mg/dL  Lipid panel   Collection Time: 02/01/24  7:45 AM  Result Value Ref Range   Cholesterol 210 (H) 0 - 200 mg/dL   Triglycerides 04.9 0.0 - 149.0 mg/dL   HDL 41.79 >60.99 mg/dL   VLDL 80.9 0.0 - 59.9 mg/dL   LDL Cholesterol 866 (H) 0 - 99 mg/dL   Total CHOL/HDL Ratio 4    NonHDL 151.67     Assessment & Plan:   Problem List Items Addressed This Visit     Health maintenance examination - Primary (Chronic)   Preventative protocols reviewed and updated unless pt declined. Discussed healthy diet and lifestyle.       Essential hypertension   Chronic, great control on current regimen - continue.       Relevant Medications   lisinopril -hydrochlorothiazide  (ZESTORETIC ) 20-12.5 MG tablet   Constipation   Intermittent - continue dulcolax/miralax PRN, discussed dietary fibera nd water intake, discussed soluble fiber supplement.       Hyperlipidemia   Chronic, mild off statin.  Consider Lp(a) given strong fmhx stroke (both parents)  The 10-year ASCVD risk score (Arnett DK, et al., 2019) is: 3.6%   Values used to calculate the score:     Age: 39 years     Clinically relevant sex: Female     Is Non-Hispanic African American: No     Diabetic: No     Tobacco smoker: No     Systolic Blood Pressure: 110 mmHg     Is BP treated: Yes     HDL Cholesterol: 58.2 mg/dL     Total Cholesterol: 210 mg/dL       Relevant Medications    lisinopril -hydrochlorothiazide  (ZESTORETIC ) 20-12.5 MG tablet   BRBPR (bright red blood per rectum)   UTD colonoscopy, latest reassuring 2019. Anticipate recent episode due to int hemorrhoid bleed.  Reviewed bowel regimen. She will let me know if recurrent bleed for further evaluation.       Other Visit Diagnoses       Need for vaccination against Streptococcus pneumoniae       Relevant Orders   Pneumococcal conjugate vaccine 20-valent (Prevnar 20) (Completed)        Meds ordered this encounter  Medications   lisinopril -hydrochlorothiazide  (ZESTORETIC ) 20-12.5 MG tablet    Sig: Take 1 tablet by mouth daily.    Dispense:  90 tablet    Refill:  3    Orders Placed This Encounter  Procedures   Pneumococcal conjugate vaccine 20-valent (Prevnar 20)    Patient Instructions  Prevnar-20 today (pneumonia shot) Work on exercise routine and diet to improve cholesterol levels  Return in 1 year for next physical  Good to see you today   Follow up plan: Return in about 1 year (around 02/18/2025) for annual exam, prior fasting for blood work.  Anton Blas, MD   "

## 2024-02-19 NOTE — Assessment & Plan Note (Signed)
 Preventative protocols reviewed and updated unless pt declined. Discussed healthy diet and lifestyle.

## 2024-02-19 NOTE — Patient Instructions (Addendum)
 Prevnar-20 today (pneumonia shot) Work on exercise routine and diet to improve cholesterol levels  Return in 1 year for next physical  Good to see you today

## 2024-02-19 NOTE — Assessment & Plan Note (Signed)
 UTD colonoscopy, latest reassuring 2019. Anticipate recent episode due to int hemorrhoid bleed.  Reviewed bowel regimen. She will let me know if recurrent bleed for further evaluation.

## 2024-02-19 NOTE — Assessment & Plan Note (Signed)
Chronic, great control on current regimen - continue.

## 2024-02-29 ENCOUNTER — Ambulatory Visit
Admission: RE | Admit: 2024-02-29 | Discharge: 2024-02-29 | Disposition: A | Source: Ambulatory Visit | Attending: Family Medicine | Admitting: Family Medicine

## 2024-02-29 DIAGNOSIS — Z1231 Encounter for screening mammogram for malignant neoplasm of breast: Secondary | ICD-10-CM

## 2024-03-05 ENCOUNTER — Ambulatory Visit: Payer: Self-pay | Admitting: Family Medicine

## 2025-02-16 ENCOUNTER — Other Ambulatory Visit

## 2025-02-23 ENCOUNTER — Encounter: Admitting: Family Medicine
